# Patient Record
Sex: Female | Born: 1970 | Race: Black or African American | Hispanic: No | Marital: Married | Smoking: Former smoker
Health system: Southern US, Community
[De-identification: ages and names within clinical notes are randomized; demographics above are authoritative.]

## PROBLEM LIST (undated history)

## (undated) DIAGNOSIS — K529 Noninfective gastroenteritis and colitis, unspecified: Secondary | ICD-10-CM

## (undated) DIAGNOSIS — Z862 Personal history of diseases of the blood and blood-forming organs and certain disorders involving the immune mechanism: Secondary | ICD-10-CM

## (undated) DIAGNOSIS — M1990 Unspecified osteoarthritis, unspecified site: Secondary | ICD-10-CM

## (undated) DIAGNOSIS — R519 Headache, unspecified: Secondary | ICD-10-CM

## (undated) DIAGNOSIS — N390 Urinary tract infection, site not specified: Secondary | ICD-10-CM

## (undated) DIAGNOSIS — Z9289 Personal history of other medical treatment: Secondary | ICD-10-CM

## (undated) DIAGNOSIS — S0990XA Unspecified injury of head, initial encounter: Secondary | ICD-10-CM

## (undated) HISTORY — DX: Urinary tract infection, site not specified: N39.0

## (undated) HISTORY — DX: Headache, unspecified: R51.9

## (undated) HISTORY — PX: EYE SURGERY: SHX253

## (undated) HISTORY — DX: Unspecified osteoarthritis, unspecified site: M19.90

## (undated) HISTORY — DX: Unspecified injury of head, initial encounter: S09.90XA

## (undated) HISTORY — PX: GANGLION CYST EXCISION: SHX1691

---

## 2001-12-03 HISTORY — PX: ABDOMINAL HYSTERECTOMY: SHX81

## 2003-12-04 DIAGNOSIS — S0990XA Unspecified injury of head, initial encounter: Secondary | ICD-10-CM

## 2003-12-04 HISTORY — DX: Unspecified injury of head, initial encounter: S09.90XA

## 2004-02-17 ENCOUNTER — Inpatient Hospital Stay (HOSPITAL_COMMUNITY): Admission: RE | Admit: 2004-02-17 | Discharge: 2004-02-20 | Payer: Self-pay | Admitting: Obstetrics & Gynecology

## 2004-02-25 ENCOUNTER — Inpatient Hospital Stay (HOSPITAL_COMMUNITY): Admission: EM | Admit: 2004-02-25 | Discharge: 2004-02-28 | Payer: Self-pay | Admitting: Emergency Medicine

## 2006-05-25 ENCOUNTER — Inpatient Hospital Stay: Payer: Self-pay | Admitting: Internal Medicine

## 2006-06-10 ENCOUNTER — Ambulatory Visit: Payer: Self-pay | Admitting: Obstetrics and Gynecology

## 2007-10-01 ENCOUNTER — Emergency Department: Payer: Self-pay | Admitting: Internal Medicine

## 2007-12-21 ENCOUNTER — Emergency Department: Payer: Self-pay | Admitting: Emergency Medicine

## 2008-03-19 ENCOUNTER — Emergency Department: Payer: Self-pay | Admitting: Emergency Medicine

## 2009-05-31 ENCOUNTER — Emergency Department: Payer: Self-pay | Admitting: Emergency Medicine

## 2009-08-25 ENCOUNTER — Emergency Department: Payer: Self-pay | Admitting: Emergency Medicine

## 2010-04-20 ENCOUNTER — Emergency Department: Payer: Self-pay | Admitting: Emergency Medicine

## 2010-08-07 ENCOUNTER — Inpatient Hospital Stay: Payer: Self-pay | Admitting: Psychiatry

## 2010-08-17 ENCOUNTER — Emergency Department: Payer: Self-pay | Admitting: Emergency Medicine

## 2012-08-23 ENCOUNTER — Emergency Department: Payer: Self-pay | Admitting: Emergency Medicine

## 2012-08-23 LAB — COMPREHENSIVE METABOLIC PANEL
Anion Gap: 10 (ref 7–16)
Chloride: 109 mmol/L — ABNORMAL HIGH (ref 98–107)
Co2: 23 mmol/L (ref 21–32)
EGFR (African American): >60
EGFR (Non-African Amer.): >60
Glucose: 104 mg/dL — ABNORMAL HIGH (ref 65–99)
Potassium: 4 mmol/L (ref 3.5–5.1)
SGOT(AST): 23 U/L (ref 15–37)
SGPT (ALT): 23 U/L (ref 12–78)

## 2012-08-23 LAB — CBC
HCT: 39.5 % (ref 35.0–47.0)
HGB: 13.2 g/dL (ref 12.0–16.0)
MCV: 93 fL (ref 80–100)
Platelet: 148 10*3/uL — ABNORMAL LOW (ref 150–440)
RBC: 4.23 10*6/uL (ref 3.80–5.20)

## 2012-08-23 LAB — URINALYSIS, COMPLETE
Blood: NEGATIVE
Glucose,UR: NEGATIVE mg/dL (ref 0–75)
Leukocyte Esterase: NEGATIVE
Protein: NEGATIVE
RBC,UR: 1 /HPF (ref 0–5)
Specific Gravity: 1.014 (ref 1.003–1.030)
Squamous Epithelial: 4
WBC UR: 2 /HPF (ref 0–5)

## 2012-09-06 ENCOUNTER — Emergency Department: Payer: Self-pay | Admitting: Internal Medicine

## 2012-09-06 LAB — COMPREHENSIVE METABOLIC PANEL
Albumin: 3.7 g/dL (ref 3.4–5.0)
Alkaline Phosphatase: 46 U/L — ABNORMAL LOW (ref 50–136)
Bilirubin,Total: 0.4 mg/dL (ref 0.2–1.0)
Calcium, Total: 8.6 mg/dL (ref 8.5–10.1)
Creatinine: 0.8 mg/dL (ref 0.60–1.30)
Glucose: 134 mg/dL — ABNORMAL HIGH (ref 65–99)
SGPT (ALT): 41 U/L (ref 12–78)
Sodium: 142 mmol/L (ref 136–145)
Total Protein: 6.6 g/dL (ref 6.4–8.2)

## 2012-09-06 LAB — URINALYSIS, COMPLETE
Blood: NEGATIVE
Glucose,UR: NEGATIVE mg/dL (ref 0–75)
Nitrite: NEGATIVE
Ph: 6 (ref 4.5–8.0)
RBC,UR: 2 /HPF (ref 0–5)
Squamous Epithelial: 3

## 2012-09-06 LAB — CBC
MCH: 31.8 pg (ref 26.0–34.0)
MCHC: 34 g/dL (ref 32.0–36.0)
MCV: 94 fL (ref 80–100)
Platelet: 184 10*3/uL (ref 150–440)
RBC: 4.36 10*6/uL (ref 3.80–5.20)

## 2013-07-22 ENCOUNTER — Emergency Department: Payer: Self-pay | Admitting: Emergency Medicine

## 2013-07-23 ENCOUNTER — Ambulatory Visit: Payer: Self-pay | Admitting: Internal Medicine

## 2013-08-05 ENCOUNTER — Ambulatory Visit (INDEPENDENT_AMBULATORY_CARE_PROVIDER_SITE_OTHER): Payer: Managed Care, Other (non HMO) | Admitting: Internal Medicine

## 2013-08-05 ENCOUNTER — Ambulatory Visit: Payer: Self-pay | Admitting: Internal Medicine

## 2013-08-05 ENCOUNTER — Encounter: Payer: Self-pay | Admitting: Internal Medicine

## 2013-08-05 VITALS — BP 108/88 | HR 78 | Temp 98.3°F | Ht 61.0 in | Wt 151.0 lb

## 2013-08-05 DIAGNOSIS — R1032 Left lower quadrant pain: Secondary | ICD-10-CM | POA: Insufficient documentation

## 2013-08-05 DIAGNOSIS — R238 Other skin changes: Secondary | ICD-10-CM

## 2013-08-05 DIAGNOSIS — R2243 Localized swelling, mass and lump, lower limb, bilateral: Secondary | ICD-10-CM | POA: Insufficient documentation

## 2013-08-05 DIAGNOSIS — Z1239 Encounter for other screening for malignant neoplasm of breast: Secondary | ICD-10-CM

## 2013-08-05 DIAGNOSIS — R609 Edema, unspecified: Secondary | ICD-10-CM

## 2013-08-05 DIAGNOSIS — R600 Localized edema: Secondary | ICD-10-CM | POA: Insufficient documentation

## 2013-08-05 DIAGNOSIS — Z0000 Encounter for general adult medical examination without abnormal findings: Secondary | ICD-10-CM

## 2013-08-05 LAB — COMPREHENSIVE METABOLIC PANEL
Albumin: 3.8 g/dL (ref 3.5–5.2)
BUN: 7 mg/dL (ref 6–23)
CO2: 27 mEq/L (ref 19–32)
Calcium: 9.6 mg/dL (ref 8.4–10.5)
Chloride: 107 mEq/L (ref 96–112)
GFR: 120.64 mL/min (ref >60.00)
Glucose, Bld: 79 mg/dL (ref 70–99)
Potassium: 3.5 mEq/L (ref 3.5–5.1)
Sodium: 140 mEq/L (ref 135–145)
Total Protein: 6.2 g/dL (ref 6.0–8.3)

## 2013-08-05 LAB — POCT URINALYSIS DIPSTICK
Bilirubin, UA: NEGATIVE
Glucose, UA: NEGATIVE
Ketones, UA: NEGATIVE
Leukocytes, UA: NEGATIVE
Nitrite, UA: NEGATIVE

## 2013-08-05 LAB — LIPID PANEL
Cholesterol: 177 mg/dL (ref 0–200)
LDL Cholesterol: 97 mg/dL (ref 0–99)
Triglycerides: 161 mg/dL — ABNORMAL HIGH (ref 0.0–149.0)

## 2013-08-05 LAB — CBC WITH DIFFERENTIAL/PLATELET
Basophils Absolute: 0 10*3/uL (ref 0.0–0.1)
Basophils Relative: 0.2 % (ref 0.0–3.0)
Eosinophils Absolute: 0.1 10*3/uL (ref 0.0–0.7)
HCT: 41.4 % (ref 36.0–46.0)
Hemoglobin: 14.1 g/dL (ref 12.0–15.0)
Lymphocytes Relative: 22.4 % (ref 12.0–46.0)
Lymphs Abs: 2.4 10*3/uL (ref 0.7–4.0)
MCHC: 34 g/dL (ref 30.0–36.0)
Neutro Abs: 7.6 10*3/uL (ref 1.4–7.7)
RBC: 4.48 Mil/uL (ref 3.87–5.11)
RDW: 13.9 % (ref 11.5–14.6)

## 2013-08-05 NOTE — Assessment & Plan Note (Signed)
Nonpitting, bilateral lower extremity edema. Question hypothyroidism. Will check TSH with labs today. If labs are normal, would favor getting lower extremity venous Dopplers for further evaluation of chronic venous insufficiency.

## 2013-08-05 NOTE — Assessment & Plan Note (Signed)
Chronic left lower quadrant pain. Question if this may be related to scar tissue from endometriosis. Will get pelvic ultrasound for further evaluation. If ultrasound unrevealing, we discussed that we may need to proceed with CT of the abdomen.

## 2013-08-05 NOTE — Assessment & Plan Note (Signed)
Symptoms of easy bruising and menorrhagia are most consistent with von Willebrand's disease. Will send CBC and von Willebrand's panel with labs today.
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
## 2013-08-05 NOTE — Assessment & Plan Note (Signed)
Mammogram ordered today 

## 2013-08-06 ENCOUNTER — Telehealth: Payer: Self-pay | Admitting: Internal Medicine

## 2013-08-06 NOTE — Telephone Encounter (Addendum)
Pelvic US showed a small right ovarian cyst, however no other abnormalities to explain left lower quadrant pain. If pain is persistent we should get a CT of the abdomen and pelvis for further evaluation.

## 2013-08-07 LAB — VON WILLEBRAND PANEL: Ristocetin Co-factor, Plasma: 141 % (ref 42–200)

## 2013-08-07 NOTE — Telephone Encounter (Signed)
Tried to call patient but the number we have on file is not in service. Will mail letter to patient home address on file.

## 2013-08-10 ENCOUNTER — Encounter: Payer: Self-pay | Admitting: *Deleted

## 2013-08-11 ENCOUNTER — Encounter: Payer: Self-pay | Admitting: *Deleted

## 2013-08-11 NOTE — Telephone Encounter (Signed)
Letter mailed to patient home address on file  

## 2013-08-20 ENCOUNTER — Encounter: Payer: Self-pay | Admitting: Internal Medicine

## 2013-12-25 ENCOUNTER — Encounter: Payer: Self-pay | Admitting: Adult Health

## 2013-12-25 ENCOUNTER — Encounter (INDEPENDENT_AMBULATORY_CARE_PROVIDER_SITE_OTHER): Payer: Self-pay

## 2013-12-25 ENCOUNTER — Ambulatory Visit (INDEPENDENT_AMBULATORY_CARE_PROVIDER_SITE_OTHER): Payer: Managed Care, Other (non HMO) | Admitting: Adult Health

## 2013-12-25 VITALS — BP 110/72 | HR 83 | Temp 97.9°F | Resp 12 | Wt 150.5 lb

## 2013-12-25 DIAGNOSIS — L299 Pruritus, unspecified: Secondary | ICD-10-CM

## 2013-12-25 NOTE — Progress Notes (Signed)
Pre visit review using our clinic review tool, if applicable. No additional management support is needed unless otherwise documented below in the visit note. 

## 2013-12-25 NOTE — Assessment & Plan Note (Signed)
Benadryl 25 mg every 6 hours as needed. Avveno Body Wash - Oatmeal If no improvement within 1 week please call.

## 2013-12-25 NOTE — Progress Notes (Signed)
   Subjective:    Patient ID: Elisha PonderMichelle Karel, female    DOB: 12/20/1970, 43 y.o.   MRN: [id number]  HPI Patient is a pleasant 43 year old female who presents to clinic with complaints of "itching all over". Patient reports that she was exposed to poison ivy during the summer. She has been itching off and on ever since. She reports not using any new lotions, soaps, detergents, fabric softeners, etc. She does not take any medication. She has not tried any medication for the itchiness. She has irritation in between her fingers. No rash is or eruptions on her skin elsewhere.   Review of Systems Positive for skin itchiness everywhere except genitals Negative for rash, skin breakouts, using any new products (lotions, soaps, detergents, perfumes, fabric softeners, medications)    Objective:   Physical Exam  Constitutional: She is oriented to person, place, and time. She appears well-developed and well-nourished. No distress.  Cardiovascular: Normal rate and regular rhythm.   Pulmonary/Chest: Effort normal. No respiratory distress.  Neurological: She is alert and oriented to person, place, and time.  Skin: Skin is warm and dry. No rash noted. No erythema.  Psychiatric: She has a normal mood and affect. Her behavior is normal. Judgment and thought content normal.          Assessment & Plan:

## 2013-12-25 NOTE — Patient Instructions (Signed)
  Start benadryl 25 mg every 6 hours as needed for itching. Use this for approximately 1 week.  Use Aveno body wash - oatmeal.  If no improvement in 1 week please call the office.

## 2014-01-05 ENCOUNTER — Telehealth: Payer: Self-pay | Admitting: Internal Medicine

## 2014-01-05 NOTE — Telephone Encounter (Signed)
Relevant patient education mailed to patient.  

## 2014-01-21 ENCOUNTER — Ambulatory Visit: Payer: Managed Care, Other (non HMO) | Admitting: Internal Medicine

## 2014-01-22 ENCOUNTER — Ambulatory Visit: Payer: Managed Care, Other (non HMO) | Admitting: Internal Medicine

## 2014-01-27 ENCOUNTER — Emergency Department: Payer: Self-pay | Admitting: Emergency Medicine

## 2014-02-02 ENCOUNTER — Telehealth: Payer: Self-pay | Admitting: *Deleted

## 2014-02-02 NOTE — Telephone Encounter (Signed)
Can we request the notes from the ED visit so I can see what they wrote for?

## 2014-02-02 NOTE — Telephone Encounter (Signed)
Left message to call back  

## 2014-02-02 NOTE — Telephone Encounter (Signed)
Patient called stating she has a rash all over and it is getting worse. It is now in her eyes and her throat is burning. She did go to the ED and they told her it was scabies. She has an appointment with Dr. Dan HumphreysWalker on Thursday but state she can not wait that long to be seen, the itching is driving her crazy and it is very painful.

## 2014-02-03 NOTE — Telephone Encounter (Signed)
Left another message on patient voicemail requesting a return call. Also stated to continue the Benadryl per Dr. Tilman NeatWalker's instructions and any antibiotics that were given to her when she went to the ED.

## 2014-02-04 ENCOUNTER — Encounter (INDEPENDENT_AMBULATORY_CARE_PROVIDER_SITE_OTHER): Payer: Self-pay

## 2014-02-04 ENCOUNTER — Ambulatory Visit (INDEPENDENT_AMBULATORY_CARE_PROVIDER_SITE_OTHER): Payer: Managed Care, Other (non HMO) | Admitting: Internal Medicine

## 2014-02-04 ENCOUNTER — Encounter: Payer: Self-pay | Admitting: Internal Medicine

## 2014-02-04 VITALS — BP 102/82 | HR 92 | Temp 98.6°F | Wt 145.0 lb

## 2014-02-04 DIAGNOSIS — F411 Generalized anxiety disorder: Secondary | ICD-10-CM | POA: Insufficient documentation

## 2014-02-04 DIAGNOSIS — R51 Headache: Secondary | ICD-10-CM

## 2014-02-04 DIAGNOSIS — R519 Headache, unspecified: Secondary | ICD-10-CM | POA: Insufficient documentation

## 2014-02-04 DIAGNOSIS — B86 Scabies: Secondary | ICD-10-CM

## 2014-02-04 MED ORDER — IVERMECTIN 3 MG PO TABS: 200.0000 ug/kg | ORAL_TABLET | Freq: Once | ORAL | Status: DC

## 2014-02-04 MED ORDER — ALPRAZOLAM 0.25 MG PO TABS: 0.2500 mg | ORAL_TABLET | Freq: Two times a day (BID) | ORAL | Status: DC | PRN

## 2014-02-04 NOTE — Patient Instructions (Signed)
Scabies  Scabies are small bugs (mites) that burrow under the skin and cause red bumps and severe itching. These bugs can only be seen with a microscope. Scabies are highly contagious. They can spread easily from person to person by direct contact. They are also spread through sharing clothing or linens that have the scabies mites living in them. It is not unusual for an entire family to become infected through shared towels, clothing, or bedding.   HOME CARE INSTRUCTIONS   · Your caregiver may prescribe a cream or lotion to kill the mites. If cream is prescribed, massage the cream into the entire body from the neck to the bottom of both feet. Also massage the cream into the scalp and face if your child is less than 1 year old. Avoid the eyes and mouth. Do not wash your hands after application.  · Leave the cream on for 8 to 12 hours. Your child should bathe or shower after the 8 to 12 hour application period. Sometimes it is helpful to apply the cream to your child right before bedtime.  · One treatment is usually effective and will eliminate approximately 95% of infestations. For severe cases, your caregiver may decide to repeat the treatment in 1 week. Everyone in your household should be treated with one application of the cream.  · New rashes or burrows should not appear within 24 to 48 hours after successful treatment. However, the itching and rash may last for 2 to 4 weeks after successful treatment. Your caregiver may prescribe a medicine to help with the itching or to help the rash go away more quickly.  · Scabies can live on clothing or linens for up to 3 days. All of your child's recently used clothing, towels, stuffed toys, and bed linens should be washed in hot water and then dried in a dryer for at least 20 minutes on high heat. Items that cannot be washed should be enclosed in a plastic bag for at least 3 days.  · To help relieve itching, bathe your child in a cool bath or apply cool washcloths to the  affected areas.  · Your child may return to school after treatment with the prescribed cream.  SEEK MEDICAL CARE IF:   · The itching persists longer than 4 weeks after treatment.  · The rash spreads or becomes infected. Signs of infection include red blisters or yellow-tan crust.  Document Released: 11/19/2005 Document Revised: 02/11/2012 Document Reviewed: 03/30/2009  [employer]® Patient Information ©2014 [employer].

## 2014-02-04 NOTE — Progress Notes (Signed)
Pre visit review using our clinic review tool, if applicable. No additional management support is needed unless otherwise documented below in the visit note. 

## 2014-02-04 NOTE — Assessment & Plan Note (Signed)
Symptoms and exam consistent with scabies. Pt has not started treatment as prescribed in the ED. She requests oral treatment. Will start Ivermectin 2300mcg/kg x1 today and then repeated in 2 weeks. Recommended cleaning her home, including sheets, clothes, etc. Information given on Scabies today. Follow up if any questions or concerns.
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
## 2014-02-04 NOTE — Assessment & Plan Note (Signed)
Persistent headache. Likely exacerbated by tension and anxiety, however given h/o traumatic brain injury with ICH, will get MRI brain for further evaluation.

## 2014-02-04 NOTE — Assessment & Plan Note (Signed)
Recent increased anxiety related to situation with roommate and scabies infection. Will start prn alprazolam. Encouraged her to be cautious with this medication as it may cause drowsiness.

## 2014-02-05 ENCOUNTER — Telehealth: Payer: Self-pay | Admitting: Internal Medicine

## 2014-02-05 ENCOUNTER — Ambulatory Visit: Payer: Self-pay | Admitting: Internal Medicine

## 2014-02-05 NOTE — Telephone Encounter (Signed)
Relevant patient education mailed to patient.  

## 2014-02-05 NOTE — Telephone Encounter (Signed)
Pt seen in office yesterday.

## 2014-02-08 ENCOUNTER — Telehealth: Payer: Self-pay | Admitting: Internal Medicine

## 2014-02-08 NOTE — Telephone Encounter (Signed)
Patient informed and verbalized understanding

## 2014-02-08 NOTE — Telephone Encounter (Signed)
MRI brain showed chronic changes consistent with history of previous head injury, but no new findings.

## 2014-02-16 ENCOUNTER — Ambulatory Visit: Payer: Managed Care, Other (non HMO) | Admitting: Internal Medicine

## 2014-02-24 ENCOUNTER — Encounter: Payer: Self-pay | Admitting: Internal Medicine

## 2014-02-25 ENCOUNTER — Encounter: Payer: Self-pay | Admitting: Internal Medicine

## 2014-02-25 ENCOUNTER — Ambulatory Visit (INDEPENDENT_AMBULATORY_CARE_PROVIDER_SITE_OTHER): Payer: Managed Care, Other (non HMO) | Admitting: Internal Medicine

## 2014-02-25 ENCOUNTER — Encounter (INDEPENDENT_AMBULATORY_CARE_PROVIDER_SITE_OTHER): Payer: Self-pay

## 2014-02-25 VITALS — BP 120/88 | HR 87 | Temp 98.2°F | Wt 146.0 lb

## 2014-02-25 DIAGNOSIS — N952 Postmenopausal atrophic vaginitis: Secondary | ICD-10-CM

## 2014-02-25 DIAGNOSIS — Z1239 Encounter for other screening for malignant neoplasm of breast: Secondary | ICD-10-CM

## 2014-02-25 DIAGNOSIS — B86 Scabies: Secondary | ICD-10-CM

## 2014-02-25 MED ORDER — ESTROGENS, CONJUGATED 0.625 MG/GM VA CREA: 1.0000 | TOPICAL_CREAM | Freq: Every day | VAGINAL | Status: DC

## 2014-02-25 MED ORDER — PERMETHRIN 5 % EX CREA: 1.0000 "application " | TOPICAL_CREAM | Freq: Once | CUTANEOUS | Status: DC

## 2014-02-25 NOTE — Assessment & Plan Note (Signed)
Symptoms most consistent with atrophic vaginitis. Will try topical premarin. Discussed potential risks of this medication. Plan follow up in 6 months and prn.

## 2014-02-25 NOTE — Progress Notes (Addendum)
Subjective:    Patient ID: Tara Bray, female    DOB: 1971-07-22, 43 y.o.   MRN: [id number]  HPI 43YO female presents for follow up.  Scabies - she completed treatment with ivermectin and had some improvement and itching. However, her roommate did not seek treatment. She did not clean her home or car. She now reports to me current symptoms of itching and red bumps over her abdomen and hands. She is scheduled to take a second dose of ivermectin today.  She is also concerned about some recent vaginal dryness. This is been ongoing for several months. She denies vaginal discharge or bleeding. No vaginal pain.  Review of Systems  Constitutional: Negative for fever, chills, appetite change, fatigue and unexpected weight change.  HENT: Negative for congestion, ear pain, sinus pressure, sore throat, trouble swallowing and voice change.   Eyes: Negative for visual disturbance.  Respiratory: Negative for cough, shortness of breath, wheezing and stridor.   Cardiovascular: Negative for chest pain, palpitations and leg swelling.  Gastrointestinal: Negative for nausea, vomiting, abdominal pain, diarrhea, constipation, blood in stool, abdominal distention and anal bleeding.  Genitourinary: Positive for dyspareunia. Negative for dysuria, flank pain, vaginal bleeding, vaginal discharge and vaginal pain.  Musculoskeletal: Negative for arthralgias, gait problem, myalgias and neck pain.  Skin: Positive for rash. Negative for color change.  Neurological: Negative for dizziness and headaches.  Hematological: Negative for adenopathy. Does not bruise/bleed easily.  Psychiatric/Behavioral: Negative for suicidal ideas, sleep disturbance and dysphoric mood. The patient is not nervous/anxious.        Objective:    BP 120/88  Pulse 87  Temp(Src) 98.2 F (36.8 C) (Oral)  Wt 146 lb (66.225 kg)  SpO2 98% Physical Exam  Constitutional: She is oriented to person, place, and time. She appears  well-developed and well-nourished. No distress.  HENT:  Head: Normocephalic and atraumatic.  Right Ear: External ear normal.  Left Ear: External ear normal.  Nose: Nose normal.  Mouth/Throat: Oropharynx is clear and moist.  Eyes: Conjunctivae are normal. Pupils are equal, round, and reactive to light. Right eye exhibits no discharge. Left eye exhibits no discharge. No scleral icterus.  Neck: Normal range of motion. Neck supple. No tracheal deviation present. No thyromegaly present.  Pulmonary/Chest: Effort normal. No accessory muscle usage. Not tachypneic. She has no decreased breath sounds. She has no rhonchi.  Musculoskeletal: Normal range of motion. She exhibits no edema and no tenderness.  Lymphadenopathy:    She has no cervical adenopathy.  Neurological: She is alert and oriented to person, place, and time. No cranial nerve deficit. She exhibits normal muscle tone. Coordination normal.  Skin: Skin is warm and dry. Rash (erythematous papules hands) noted. She is not diaphoretic. No erythema. No pallor.  Psychiatric: She has a normal mood and affect. Her behavior is normal. Judgment and thought content normal.          Assessment & Plan:   Problem List Items Addressed This Visit   Atrophic vaginitis     Symptoms most consistent with atrophic vaginitis. Will try topical premarin. Discussed potential risks of this medication. Plan follow up in 6 months and prn.    Relevant Medications      PREMARIN 0.625 MG/GM VA CREA   Scabies - Primary     Week current scabies. She did not follow instructions about cleaning her house and car. She will take a second dose of ivermectin today. Reviewed how important it is for her to clean her car, clothes, and  furniture. Will add course of topical permethrin in 1 week after she completes cleaning of her home.    Relevant Medications      ACTICIN 5 % EX CREA   Screening for breast cancer   Relevant Orders      MM Digital Screening        Return in about 6 months (around 08/28/2014), or if symptoms worsen or fail to improve, for Physical.

## 2014-02-25 NOTE — Progress Notes (Signed)
Pre visit review using our clinic review tool, if applicable. No additional management support is needed unless otherwise documented below in the visit note. 

## 2014-02-25 NOTE — Patient Instructions (Signed)
Scabies  Scabies are small bugs (mites) that burrow under the skin and cause red bumps and severe itching. These bugs can only be seen with a microscope. Scabies are highly contagious. They can spread easily from person to person by direct contact. They are also spread through sharing clothing or linens that have the scabies mites living in them. It is not unusual for an entire family to become infected through shared towels, clothing, or bedding.   HOME CARE INSTRUCTIONS   · Your caregiver may prescribe a cream or lotion to kill the mites. If cream is prescribed, massage the cream into the entire body from the neck to the bottom of both feet. Also massage the cream into the scalp and face if your child is less than 1 year old. Avoid the eyes and mouth. Do not wash your hands after application.  · Leave the cream on for 8 to 12 hours. Your child should bathe or shower after the 8 to 12 hour application period. Sometimes it is helpful to apply the cream to your child right before bedtime.  · One treatment is usually effective and will eliminate approximately 95% of infestations. For severe cases, your caregiver may decide to repeat the treatment in 1 week. Everyone in your household should be treated with one application of the cream.  · New rashes or burrows should not appear within 24 to 48 hours after successful treatment. However, the itching and rash may last for 2 to 4 weeks after successful treatment. Your caregiver may prescribe a medicine to help with the itching or to help the rash go away more quickly.  · Scabies can live on clothing or linens for up to 3 days. All of your child's recently used clothing, towels, stuffed toys, and bed linens should be washed in hot water and then dried in a dryer for at least 20 minutes on high heat. Items that cannot be washed should be enclosed in a plastic bag for at least 3 days.  · To help relieve itching, bathe your child in a cool bath or apply cool washcloths to the  affected areas.  · Your child may return to school after treatment with the prescribed cream.  SEEK MEDICAL CARE IF:   · The itching persists longer than 4 weeks after treatment.  · The rash spreads or becomes infected. Signs of infection include red blisters or yellow-tan crust.  Document Released: 11/19/2005 Document Revised: 02/11/2012 Document Reviewed: 03/30/2009  [employer]® Patient Information ©2014 [employer].

## 2014-02-25 NOTE — Assessment & Plan Note (Signed)
Week current scabies. She did not follow instructions about cleaning her house and car. She will take a second dose of ivermectin today. Reviewed how important it is for her to clean her car, clothes, and furniture. Will add course of topical permethrin in 1 week after she completes cleaning of her home.

## 2014-02-26 ENCOUNTER — Encounter: Payer: Self-pay | Admitting: Emergency Medicine

## 2014-08-18 ENCOUNTER — Encounter: Payer: Self-pay | Admitting: Internal Medicine

## 2014-09-02 ENCOUNTER — Encounter: Payer: Managed Care, Other (non HMO) | Admitting: Internal Medicine

## 2014-09-20 ENCOUNTER — Telehealth: Payer: Self-pay | Admitting: Internal Medicine

## 2014-09-20 NOTE — Telephone Encounter (Signed)
Ms. Tara Bray stopped by to schedule her yearly CPE but was unable to do so because of her work schedule. She said she works from 7am-3:30pm. She said she can get here by 3:40pm if Dr. Dan HumphreysWalker will permit her to do so. I asked her if she wanted to discuss her schedule with her employer to see if they would work with her but she said, "No, there's no way I can leave early." Please call the patient.  Pt ph# [phone] Thank you.

## 2014-09-21 NOTE — Telephone Encounter (Signed)
We can use a 3:30pm on a Thursday or Friday

## 2014-09-21 NOTE — Telephone Encounter (Signed)
Left message for the patient to call back to schedule her CPE before the end of the month.

## 2014-09-21 NOTE — Telephone Encounter (Signed)
Please see dr walkers note about scheduling pt an appt

## 2014-10-01 ENCOUNTER — Ambulatory Visit (INDEPENDENT_AMBULATORY_CARE_PROVIDER_SITE_OTHER): Payer: Managed Care, Other (non HMO) | Admitting: Internal Medicine

## 2014-10-01 ENCOUNTER — Encounter: Payer: Self-pay | Admitting: Internal Medicine

## 2014-10-01 VITALS — BP 110/66 | HR 86 | Temp 98.6°F | Ht 61.5 in | Wt 151.0 lb

## 2014-10-01 DIAGNOSIS — Z0000 Encounter for general adult medical examination without abnormal findings: Secondary | ICD-10-CM

## 2014-10-01 DIAGNOSIS — F32A Depression, unspecified: Secondary | ICD-10-CM | POA: Insufficient documentation

## 2014-10-01 DIAGNOSIS — N952 Postmenopausal atrophic vaginitis: Secondary | ICD-10-CM

## 2014-10-01 DIAGNOSIS — F329 Major depressive disorder, single episode, unspecified: Secondary | ICD-10-CM

## 2014-10-01 LAB — CBC WITH DIFFERENTIAL/PLATELET
BASOS ABS: 0 10*3/uL (ref 0.0–0.1)
BASOS PCT: 0 % (ref 0–1)
EOS ABS: 0.1 10*3/uL (ref 0.0–0.7)
EOS PCT: 1 % (ref 0–5)
HEMATOCRIT: 41.6 % (ref 36.0–46.0)
Hemoglobin: 14.7 g/dL (ref 12.0–15.0)
Lymphocytes Relative: 27 % (ref 12–46)
Lymphs Abs: 2.8 10*3/uL (ref 0.7–4.0)
MCH: 31 pg (ref 26.0–34.0)
MCHC: 35.3 g/dL (ref 30.0–36.0)
MCV: 87.8 fL (ref 78.0–100.0)
MONO ABS: 0.6 10*3/uL (ref 0.1–1.0)
Monocytes Relative: 6 % (ref 3–12)
Neutro Abs: 6.9 10*3/uL (ref 1.7–7.7)
Neutrophils Relative %: 66 % (ref 43–77)
Platelets: 175 10*3/uL (ref 150–400)
RBC: 4.74 MIL/uL (ref 3.87–5.11)
RDW: 13.8 % (ref 11.5–15.5)
WBC: 10.5 10*3/uL (ref 4.0–10.5)

## 2014-10-01 LAB — COMPREHENSIVE METABOLIC PANEL
ALK PHOS: 52 U/L (ref 39–117)
ALT: 9 U/L (ref 0–35)
AST: 13 U/L (ref 0–37)
Albumin: 4 g/dL (ref 3.5–5.2)
BUN: 13 mg/dL (ref 6–23)
CO2: 26 mEq/L (ref 19–32)
CREATININE: 0.84 mg/dL (ref 0.50–1.10)
Calcium: 9.7 mg/dL (ref 8.4–10.5)
Chloride: 107 mEq/L (ref 96–112)
GLUCOSE: 83 mg/dL (ref 70–99)
Potassium: 3.7 mEq/L (ref 3.5–5.3)
Sodium: 144 mEq/L (ref 135–145)
Total Bilirubin: 0.3 mg/dL (ref 0.2–1.2)
Total Protein: 6.1 g/dL (ref 6.0–8.3)

## 2014-10-01 LAB — LIPID PANEL
CHOL/HDL RATIO: 4.1 ratio
Cholesterol: 196 mg/dL (ref 0–200)
HDL: 48 mg/dL (ref >39)
LDL CALC: 129 mg/dL — AB (ref 0–99)
TRIGLYCERIDES: 94 mg/dL (ref <150)
VLDL: 19 mg/dL (ref 0–40)

## 2014-10-01 LAB — TSH: TSH: 0.601 u[IU]/mL (ref 0.350–4.500)

## 2014-10-01 MED ORDER — FLUOXETINE HCL 20 MG PO TABS: 20.0000 mg | ORAL_TABLET | Freq: Every day | ORAL | Status: DC

## 2014-10-01 NOTE — Patient Instructions (Addendum)
Start Fluoxetine $RemoveBeforeDE'20mg'[id number]$  daily. We will set up an evaluation with a counselor at our [facility] office. Follow up here in 4 weeks.  Health Maintenance Adopting a healthy lifestyle and getting preventive care can go a long way to promote health and wellness. Talk with your health care provider about what schedule of regular examinations is right for you. This is a good chance for you to check in with your provider about disease prevention and staying healthy. In between checkups, there are plenty of things you can do on your own. Experts have done a lot of research about which lifestyle changes and preventive measures are most likely to keep you healthy. Ask your health care provider for more information. WEIGHT AND DIET  Eat a healthy diet  Be sure to include plenty of vegetables, fruits, low-fat dairy products, and lean protein.  Do not eat a lot of foods high in solid fats, added sugars, or salt.  Get regular exercise. This is one of the most important things you can do for your health.  Most adults should exercise for at least 150 minutes each week. The exercise should increase your heart rate and make you sweat (moderate-intensity exercise).  Most adults should also do strengthening exercises at least twice a week. This is in addition to the moderate-intensity exercise.  Maintain a healthy weight  Body mass index (BMI) is a measurement that can be used to identify possible weight problems. It estimates body fat based on height and weight. Your health care provider can help determine your BMI and help you achieve or maintain a healthy weight.  For females 16 years of age and older:   A BMI below 18.5 is considered underweight.  A BMI of 18.5 to 24.9 is normal.  A BMI of 25 to 29.9 is considered overweight.  A BMI of 30 and above is considered obese.  Watch levels of cholesterol and blood lipids  You should start having your blood tested for lipids and cholesterol at 44 years of  age, then have this test every 5 years.  You may need to have your cholesterol levels checked more often if:  Your lipid or cholesterol levels are high.  You are older than 44 years of age.  You are at high risk for heart disease.  CANCER SCREENING   Lung Cancer  Lung cancer screening is recommended for adults 5-56 years old who are at high risk for lung cancer because of a history of smoking.  A yearly low-dose CT scan of the lungs is recommended for people who:  Currently smoke.  Have quit within the past 15 years.  Have at least a 30-pack-year history of smoking. A pack year is smoking an average of one pack of cigarettes a day for 1 year.  Yearly screening should continue until it has been 15 years since you quit.  Yearly screening should stop if you develop a health problem that would prevent you from having lung cancer treatment.  Breast Cancer  Practice breast self-awareness. This means understanding how your breasts normally appear and feel.  It also means doing regular breast self-exams. Let your health care provider know about any changes, no matter how small.  If you are in your 20s or 30s, you should have a clinical breast exam (CBE) by a health care provider every 1-3 years as part of a regular health exam.  If you are 12 or older, have a CBE every year. Also consider having a breast X-ray (mammogram)  every year.  If you have a family history of breast cancer, talk to your health care provider about genetic screening.  If you are at high risk for breast cancer, talk to your health care provider about having an MRI and a mammogram every year.  Breast cancer gene (BRCA) assessment is recommended for women who have family members with BRCA-related cancers. BRCA-related cancers include:  Breast.  Ovarian.  Tubal.  Peritoneal cancers.  Results of the assessment will determine the need for genetic counseling and BRCA1 and BRCA2 testing. Cervical  Cancer Routine pelvic examinations to screen for cervical cancer are no longer recommended for nonpregnant women who are considered low risk for cancer of the pelvic organs (ovaries, uterus, and vagina) and who do not have symptoms. A pelvic examination may be necessary if you have symptoms including those associated with pelvic infections. Ask your health care provider if a screening pelvic exam is right for you.   The Pap test is the screening test for cervical cancer for women who are considered at risk.  If you had a hysterectomy for a problem that was not cancer or a condition that could lead to cancer, then you no longer need Pap tests.  If you are older than 65 years, and you have had normal Pap tests for the past 10 years, you no longer need to have Pap tests.  If you have had past treatment for cervical cancer or a condition that could lead to cancer, you need Pap tests and screening for cancer for at least 20 years after your treatment.  If you no longer get a Pap test, assess your risk factors if they change (such as having a new sexual partner). This can affect whether you should start being screened again.  Some women have medical problems that increase their chance of getting cervical cancer. If this is the case for you, your health care provider may recommend more frequent screening and Pap tests.  The human papillomavirus (HPV) test is another test that may be used for cervical cancer screening. The HPV test looks for the virus that can cause cell changes in the cervix. The cells collected during the Pap test can be tested for HPV.  The HPV test can be used to screen women 50 years of age and older. Getting tested for HPV can extend the interval between normal Pap tests from three to five years.  An HPV test also should be used to screen women of any age who have unclear Pap test results.  After 44 years of age, women should have HPV testing as often as Pap tests.  Colorectal  Cancer  This type of cancer can be detected and often prevented.  Routine colorectal cancer screening usually begins at 44 years of age and continues through 44 years of age.  Your health care provider may recommend screening at an earlier age if you have risk factors for colon cancer.  Your health care provider may also recommend using home test kits to check for hidden blood in the stool.  A small camera at the end of a tube can be used to examine your colon directly (sigmoidoscopy or colonoscopy). This is done to check for the earliest forms of colorectal cancer.  Routine screening usually begins at age 20.  Direct examination of the colon should be repeated every 5-10 years through 44 years of age. However, you may need to be screened more often if early forms of precancerous polyps or small growths are  found. Skin Cancer  Check your skin from head to toe regularly.  Tell your health care provider about any new moles or changes in moles, especially if there is a change in a mole's shape or color.  Also tell your health care provider if you have a mole that is larger than the size of a pencil eraser.  Always use sunscreen. Apply sunscreen liberally and repeatedly throughout the day.  Protect yourself by wearing long sleeves, pants, a wide-brimmed hat, and sunglasses whenever you are outside. HEART DISEASE, DIABETES, AND HIGH BLOOD PRESSURE   Have your blood pressure checked at least every 1-2 years. High blood pressure causes heart disease and increases the risk of stroke.  If you are between 8 years and 32 years old, ask your health care provider if you should take aspirin to prevent strokes.  Have regular diabetes screenings. This involves taking a blood sample to check your fasting blood sugar level.  If you are at a normal weight and have a low risk for diabetes, have this test once every three years after 44 years of age.  If you are overweight and have a high risk for  diabetes, consider being tested at a younger age or more often. PREVENTING INFECTION  Hepatitis B  If you have a higher risk for hepatitis B, you should be screened for this virus. You are considered at high risk for hepatitis B if:  You were born in a country where hepatitis B is common. Ask your health care provider which countries are considered high risk.  Your parents were born in a high-risk country, and you have not been immunized against hepatitis B (hepatitis B vaccine).  You have HIV or AIDS.  You use needles to inject street drugs.  You live with someone who has hepatitis B.  You have had sex with someone who has hepatitis B.  You get hemodialysis treatment.  You take certain medicines for conditions, including cancer, organ transplantation, and autoimmune conditions. Hepatitis C  Blood testing is recommended for:  Everyone born from [birth year] through 1966.  Anyone with known risk factors for hepatitis C. Sexually transmitted infections (STIs)  You should be screened for sexually transmitted infections (STIs) including gonorrhea and chlamydia if:  You are sexually active and are younger than 44 years of age.  You are older than 44 years of age and your health care provider tells you that you are at risk for this type of infection.  Your sexual activity has changed since you were last screened and you are at an increased risk for chlamydia or gonorrhea. Ask your health care provider if you are at risk.  If you do not have HIV, but are at risk, it may be recommended that you take a prescription medicine daily to prevent HIV infection. This is called pre-exposure prophylaxis (PrEP). You are considered at risk if:  You are sexually active and do not regularly use condoms or know the HIV status of your partner(s).  You take drugs by injection.  You are sexually active with a partner who has HIV. Talk with your health care provider about whether you are at high risk of  being infected with HIV. If you choose to begin PrEP, you should first be tested for HIV. You should then be tested every 3 months for as long as you are taking PrEP.  PREGNANCY   If you are premenopausal and you may become pregnant, ask your health care provider about preconception counseling.  If  you may become pregnant, take 400 to 800 micrograms (mcg) of folic acid every day.  If you want to prevent pregnancy, talk to your health care provider about birth control (contraception). OSTEOPOROSIS AND MENOPAUSE   Osteoporosis is a disease in which the bones lose minerals and strength with aging. This can result in serious bone fractures. Your risk for osteoporosis can be identified using a bone density scan.  If you are 100 years of age or older, or if you are at risk for osteoporosis and fractures, ask your health care provider if you should be screened.  Ask your health care provider whether you should take a calcium or vitamin D supplement to lower your risk for osteoporosis.  Menopause may have certain physical symptoms and risks.  Hormone replacement therapy may reduce some of these symptoms and risks. Talk to your health care provider about whether hormone replacement therapy is right for you.  HOME CARE INSTRUCTIONS   Schedule regular health, dental, and eye exams.  Stay current with your immunizations.   Do not use any tobacco products including cigarettes, chewing tobacco, or electronic cigarettes.  If you are pregnant, do not drink alcohol.  If you are breastfeeding, limit how much and how often you drink alcohol.  Limit alcohol intake to no more than 1 drink per day for nonpregnant women. One drink equals 12 ounces of beer, 5 ounces of wine, or 1 ounces of hard liquor.  Do not use street drugs.  Do not share needles.  Ask your health care provider for help if you need support or information about quitting drugs.  Tell your health care provider if you often feel  depressed.  Tell your health care provider if you have ever been abused or do not feel safe at home. Document Released: 06/04/2011 Document Revised: 04/05/2014 Document Reviewed: 10/21/2013 [facility] Patient Information 2015 [place], [place]. This information is not intended to replace advice given to you by your health care provider. Make sure you discuss any questions you have with your health care provider.
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
## 2014-10-01 NOTE — Assessment & Plan Note (Addendum)
General medical exam including breast exam normal. PAP and pelvic deferred given h/o hysterectomy. Mammogram ordered. Labs today including CBC, CMP, lipids. Flu vaccine declined.

## 2014-10-01 NOTE — Progress Notes (Signed)
Pre visit review using our clinic review tool, if applicable. No additional management support is needed unless otherwise documented below in the visit note. 

## 2014-10-01 NOTE — Assessment & Plan Note (Addendum)
Worsening symptoms of depression. PHQ9 - 16. Will set up referral for counseling. Start Fluoxetine 20mg  daily. Follow up in 4 weeks or sooner as needed. She will contract for safety.

## 2014-10-02 LAB — MICROALBUMIN / CREATININE URINE RATIO
Creatinine, Urine: 396.4 mg/dL
MICROALB UR: 1.6 mg/dL (ref <2.0)
Microalb Creat Ratio: 4 mg/g (ref 0.0–30.0)

## 2014-10-02 LAB — LUTEINIZING HORMONE: LH: 44.7 m[IU]/mL

## 2014-10-02 LAB — VITAMIN D 25 HYDROXY (VIT D DEFICIENCY, FRACTURES): Vit D, 25-Hydroxy: 11 ng/mL — ABNORMAL LOW (ref 30–89)

## 2014-10-02 LAB — FOLLICLE STIMULATING HORMONE: FSH: 105 m[IU]/mL

## 2014-10-06 ENCOUNTER — Other Ambulatory Visit: Payer: Self-pay | Admitting: *Deleted

## 2014-10-06 MED ORDER — ERGOCALCIFEROL 1.25 MG (50000 UT) PO CAPS: 50000.0000 [IU] | ORAL_CAPSULE | ORAL | Status: DC

## 2014-11-03 ENCOUNTER — Ambulatory Visit: Payer: Managed Care, Other (non HMO) | Admitting: Psychology

## 2014-11-23 ENCOUNTER — Ambulatory Visit: Payer: Managed Care, Other (non HMO) | Admitting: Internal Medicine

## 2014-12-28 ENCOUNTER — Emergency Department: Payer: Self-pay | Admitting: Internal Medicine

## 2015-03-06 ENCOUNTER — Emergency Department
Admit: 2015-03-06 | Disposition: A | Discharge status: Home / Self Care | Payer: Self-pay | Admitting: Physician Assistant

## 2015-03-06 LAB — URINALYSIS, COMPLETE
BILIRUBIN, UR: NEGATIVE
GLUCOSE, UR: NEGATIVE mg/dL (ref 0–75)
KETONE: NEGATIVE
Nitrite: NEGATIVE
PH: 5 (ref 4.5–8.0)
Protein: 30
RBC,UR: 23 /HPF (ref 0–5)
SPECIFIC GRAVITY: 1.02 (ref 1.003–1.030)
WBC UR: 169 /HPF (ref 0–5)

## 2015-03-08 LAB — URINE CULTURE

## 2015-08-23 ENCOUNTER — Emergency Department
Admission: EM | Admit: 2015-08-23 | Discharge: 2015-08-23 | Disposition: A | Discharge status: Home / Self Care | Payer: Managed Care, Other (non HMO) | Attending: Emergency Medicine | Admitting: Emergency Medicine

## 2015-08-23 ENCOUNTER — Encounter: Payer: Self-pay | Admitting: *Deleted

## 2015-08-23 DIAGNOSIS — N3090 Cystitis, unspecified without hematuria: Secondary | ICD-10-CM | POA: Insufficient documentation

## 2015-08-23 DIAGNOSIS — Z720 Tobacco use: Secondary | ICD-10-CM | POA: Insufficient documentation

## 2015-08-23 DIAGNOSIS — R300 Dysuria: Secondary | ICD-10-CM | POA: Diagnosis present

## 2015-08-23 DIAGNOSIS — Z79899 Other long term (current) drug therapy: Secondary | ICD-10-CM | POA: Insufficient documentation

## 2015-08-23 LAB — COMPREHENSIVE METABOLIC PANEL
ALT: 11 U/L — ABNORMAL LOW (ref 14–54)
ANION GAP: 7 (ref 5–15)
AST: 22 U/L (ref 15–41)
Albumin: 4.2 g/dL (ref 3.5–5.0)
Alkaline Phosphatase: 45 U/L (ref 38–126)
BUN: 8 mg/dL (ref 6–20)
CALCIUM: 10.1 mg/dL (ref 8.9–10.3)
CHLORIDE: 109 mmol/L (ref 101–111)
CO2: 29 mmol/L (ref 22–32)
Creatinine, Ser: 0.76 mg/dL (ref 0.44–1.00)
GFR calc non Af Amer: >60 mL/min (ref >60)
Glucose, Bld: 95 mg/dL (ref 65–99)
Potassium: 4.6 mmol/L (ref 3.5–5.1)
SODIUM: 145 mmol/L (ref 135–145)
Total Bilirubin: 0.5 mg/dL (ref 0.3–1.2)
Total Protein: 6.6 g/dL (ref 6.5–8.1)

## 2015-08-23 LAB — URINALYSIS COMPLETE WITH MICROSCOPIC (ARMC ONLY)
Bilirubin Urine: NEGATIVE
GLUCOSE, UA: NEGATIVE mg/dL
NITRITE: POSITIVE — AB
Protein, ur: NEGATIVE mg/dL
SPECIFIC GRAVITY, URINE: 1.012 (ref 1.005–1.030)
pH: 5 (ref 5.0–8.0)

## 2015-08-23 LAB — CBC WITH DIFFERENTIAL/PLATELET
Basophils Absolute: 0 10*3/uL (ref 0–0.1)
Basophils Relative: 0 %
EOS ABS: 0.1 10*3/uL (ref 0–0.7)
EOS PCT: 1 %
HCT: 42.1 % (ref 35.0–47.0)
Hemoglobin: 13.9 g/dL (ref 12.0–16.0)
LYMPHS ABS: 3.8 10*3/uL — AB (ref 1.0–3.6)
Lymphocytes Relative: 34 %
MCH: 30.2 pg (ref 26.0–34.0)
MCHC: 33 g/dL (ref 32.0–36.0)
MCV: 91.5 fL (ref 80.0–100.0)
MONO ABS: 0.7 10*3/uL (ref 0.2–0.9)
MONOS PCT: 6 %
Neutro Abs: 6.6 10*3/uL — ABNORMAL HIGH (ref 1.4–6.5)
Neutrophils Relative %: 59 %
PLATELETS: 149 10*3/uL — AB (ref 150–440)
RBC: 4.6 MIL/uL (ref 3.80–5.20)
RDW: 14.6 % — ABNORMAL HIGH (ref 11.5–14.5)
WBC: 11.3 10*3/uL — ABNORMAL HIGH (ref 3.6–11.0)

## 2015-08-23 MED ORDER — NITROFURANTOIN MACROCRYSTAL 100 MG PO CAPS: 100.0000 mg | ORAL_CAPSULE | Freq: Two times a day (BID) | ORAL | Status: DC

## 2015-08-23 NOTE — ED Notes (Signed)
Pt reports pain and burning upon urination for two days. Denies blood in urine. Denies nausea or vomiting. Pt reports left lower quadrant pain. Pt rates pain as 7/10.

## 2015-08-23 NOTE — Discharge Instructions (Signed)
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
## 2015-08-23 NOTE — ED Notes (Signed)
Pt has dysuria since yesterday.  Pt also reports left lower abd pain.  Pt has vaginal discharge.  No vag bleeding.

## 2015-08-24 MED ORDER — DICYCLOMINE HCL 10 MG PO CAPS
ORAL_CAPSULE | ORAL | Status: AC
  Filled 2015-08-24: qty 1

## 2015-11-09 ENCOUNTER — Encounter: Payer: Self-pay | Admitting: Internal Medicine

## 2015-11-09 ENCOUNTER — Ambulatory Visit (INDEPENDENT_AMBULATORY_CARE_PROVIDER_SITE_OTHER): Payer: Managed Care, Other (non HMO) | Admitting: Internal Medicine

## 2015-11-09 VITALS — BP 123/77 | HR 90 | Temp 98.4°F | Ht 60.5 in | Wt 146.2 lb

## 2015-11-09 DIAGNOSIS — Z0000 Encounter for general adult medical examination without abnormal findings: Secondary | ICD-10-CM | POA: Diagnosis not present

## 2015-11-09 LAB — LIPID PANEL
CHOLESTEROL: 171 mg/dL (ref 0–200)
HDL: 47.1 mg/dL (ref >39.00)
LDL CALC: 107 mg/dL — AB (ref 0–99)
NonHDL: 124.3
TRIGLYCERIDES: 88 mg/dL (ref 0.0–149.0)
Total CHOL/HDL Ratio: 4
VLDL: 17.6 mg/dL (ref 0.0–40.0)

## 2015-11-09 LAB — CBC WITH DIFFERENTIAL/PLATELET
BASOS PCT: 0.4 % (ref 0.0–3.0)
Basophils Absolute: 0 10*3/uL (ref 0.0–0.1)
EOS PCT: 1.1 % (ref 0.0–5.0)
Eosinophils Absolute: 0.1 10*3/uL (ref 0.0–0.7)
HEMATOCRIT: 37.8 % (ref 36.0–46.0)
HEMOGLOBIN: 12.1 g/dL (ref 12.0–15.0)
LYMPHS PCT: 31.6 % (ref 12.0–46.0)
Lymphs Abs: 3.2 10*3/uL (ref 0.7–4.0)
MCHC: 32 g/dL (ref 30.0–36.0)
MCV: 91.9 fl (ref 78.0–100.0)
Monocytes Absolute: 0.7 10*3/uL (ref 0.1–1.0)
Monocytes Relative: 7.1 % (ref 3.0–12.0)
NEUTROS ABS: 6.1 10*3/uL (ref 1.4–7.7)
Neutrophils Relative %: 59.8 % (ref 43.0–77.0)
PLATELETS: 150 10*3/uL (ref 150.0–400.0)
RBC: 4.12 Mil/uL (ref 3.87–5.11)
RDW: 14.1 % (ref 11.5–15.5)
WBC: 10.1 10*3/uL (ref 4.0–10.5)

## 2015-11-09 LAB — COMPREHENSIVE METABOLIC PANEL
ALBUMIN: 3.9 g/dL (ref 3.5–5.2)
ALT: 9 U/L (ref 0–35)
AST: 15 U/L (ref 0–37)
Alkaline Phosphatase: 44 U/L (ref 39–117)
BUN: 10 mg/dL (ref 6–23)
CALCIUM: 9.4 mg/dL (ref 8.4–10.5)
CHLORIDE: 110 meq/L (ref 96–112)
CO2: 28 meq/L (ref 19–32)
CREATININE: 0.62 mg/dL (ref 0.40–1.20)
GFR: 133.76 mL/min (ref >60.00)
Glucose, Bld: 82 mg/dL (ref 70–99)
POTASSIUM: 3.8 meq/L (ref 3.5–5.1)
Sodium: 143 mEq/L (ref 135–145)
Total Bilirubin: 0.3 mg/dL (ref 0.2–1.2)
Total Protein: 6 g/dL (ref 6.0–8.3)

## 2015-11-09 LAB — MICROALBUMIN / CREATININE URINE RATIO
CREATININE, U: 143.3 mg/dL
Microalb Creat Ratio: 1 mg/g (ref 0.0–30.0)
Microalb, Ur: 1.5 mg/dL (ref 0.0–1.9)

## 2015-11-09 LAB — TSH: TSH: 0.53 u[IU]/mL (ref 0.35–4.50)

## 2015-11-09 LAB — VITAMIN D 25 HYDROXY (VIT D DEFICIENCY, FRACTURES): VITD: 18.46 ng/mL — ABNORMAL LOW (ref 30.00–100.00)

## 2015-11-09 MED ORDER — FLUOXETINE HCL 20 MG PO TABS: 20.0000 mg | ORAL_TABLET | Freq: Every day | ORAL | Status: DC

## 2015-11-09 MED ORDER — ESTROGENS, CONJUGATED 0.625 MG/GM VA CREA: 1.0000 | TOPICAL_CREAM | Freq: Every day | VAGINAL | Status: DC

## 2015-11-09 MED ORDER — ALPRAZOLAM 0.25 MG PO TABS: 0.2500 mg | ORAL_TABLET | Freq: Two times a day (BID) | ORAL | Status: DC | PRN

## 2015-11-09 NOTE — Assessment & Plan Note (Signed)
General medical exam normal today including breast and pelvic exam. PAP pending. Mammogram scheduled. Flu vaccine declined. Labs as ordered. Encouraged healthy diet and exercise. Follow up 1 year and prn.

## 2015-11-09 NOTE — Progress Notes (Signed)
Pre visit review using our clinic review tool, if applicable. No additional management support is needed unless otherwise documented below in the visit note. 

## 2015-11-09 NOTE — Patient Instructions (Signed)
Health Maintenance, Female Adopting a healthy lifestyle and getting preventive care can go a long way to promote health and wellness. Talk with your health care provider about what schedule of regular examinations is right for you. This is a good chance for you to check in with your provider about disease prevention and staying healthy. In between checkups, there are plenty of things you can do on your own. Experts have done a lot of research about which lifestyle changes and preventive measures are most likely to keep you healthy. Ask your health care provider for more information. WEIGHT AND DIET  Eat a healthy diet  Be sure to include plenty of vegetables, fruits, low-fat dairy products, and lean protein.  Do not eat a lot of foods high in solid fats, added sugars, or salt.  Get regular exercise. This is one of the most important things you can do for your health.  Most adults should exercise for at least 150 minutes each week. The exercise should increase your heart rate and make you sweat (moderate-intensity exercise).  Most adults should also do strengthening exercises at least twice a week. This is in addition to the moderate-intensity exercise.  Maintain a healthy weight  Body mass index (BMI) is a measurement that can be used to identify possible weight problems. It estimates body fat based on height and weight. Your health care provider can help determine your BMI and help you achieve or maintain a healthy weight.  For females 20 years of age and older:   A BMI below 18.5 is considered underweight.  A BMI of 18.5 to 24.9 is normal.  A BMI of 25 to 29.9 is considered overweight.  A BMI of 30 and above is considered obese.  Watch levels of cholesterol and blood lipids  You should start having your blood tested for lipids and cholesterol at 45 years of age, then have this test every 5 years.  You may need to have your cholesterol levels checked more often if:  Your lipid  or cholesterol levels are high.  You are older than 45 years of age.  You are at high risk for heart disease.  CANCER SCREENING   Lung Cancer  Lung cancer screening is recommended for adults 55-80 years old who are at high risk for lung cancer because of a history of smoking.  A yearly low-dose CT scan of the lungs is recommended for people who:  Currently smoke.  Have quit within the past 15 years.  Have at least a 30-pack-year history of smoking. A pack year is smoking an average of one pack of cigarettes a day for 1 year.  Yearly screening should continue until it has been 15 years since you quit.  Yearly screening should stop if you develop a health problem that would prevent you from having lung cancer treatment.  Breast Cancer  Practice breast self-awareness. This means understanding how your breasts normally appear and feel.  It also means doing regular breast self-exams. Let your health care provider know about any changes, no matter how small.  If you are in your 20s or 30s, you should have a clinical breast exam (CBE) by a health care provider every 1-3 years as part of a regular health exam.  If you are 40 or older, have a CBE every year. Also consider having a breast X-ray (mammogram) every year.  If you have a family history of breast cancer, talk to your health care provider about genetic screening.  If you   are at high risk for breast cancer, talk to your health care provider about having an MRI and a mammogram every year.  Breast cancer gene (BRCA) assessment is recommended for women who have family members with BRCA-related cancers. BRCA-related cancers include:  Breast.  Ovarian.  Tubal.  Peritoneal cancers.  Results of the assessment will determine the need for genetic counseling and BRCA1 and BRCA2 testing. Cervical Cancer Your health care provider may recommend that you be screened regularly for cancer of the pelvic organs (ovaries, uterus, and  vagina). This screening involves a pelvic examination, including checking for microscopic changes to the surface of your cervix (Pap test). You may be encouraged to have this screening done every 3 years, beginning at age 21.  For women ages 30-65, health care providers may recommend pelvic exams and Pap testing every 3 years, or they may recommend the Pap and pelvic exam, combined with testing for human papilloma virus (HPV), every 5 years. Some types of HPV increase your risk of cervical cancer. Testing for HPV may also be done on women of any age with unclear Pap test results.  Other health care providers may not recommend any screening for nonpregnant women who are considered low risk for pelvic cancer and who do not have symptoms. Ask your health care provider if a screening pelvic exam is right for you.  If you have had past treatment for cervical cancer or a condition that could lead to cancer, you need Pap tests and screening for cancer for at least 20 years after your treatment. If Pap tests have been discontinued, your risk factors (such as having a new sexual partner) need to be reassessed to determine if screening should resume. Some women have medical problems that increase the chance of getting cervical cancer. In these cases, your health care provider may recommend more frequent screening and Pap tests. Colorectal Cancer  This type of cancer can be detected and often prevented.  Routine colorectal cancer screening usually begins at 45 years of age and continues through 45 years of age.  Your health care provider may recommend screening at an earlier age if you have risk factors for colon cancer.  Your health care provider may also recommend using home test kits to check for hidden blood in the stool.  A small camera at the end of a tube can be used to examine your colon directly (sigmoidoscopy or colonoscopy). This is done to check for the earliest forms of colorectal  cancer.  Routine screening usually begins at age 50.  Direct examination of the colon should be repeated every 5-10 years through 45 years of age. However, you may need to be screened more often if early forms of precancerous polyps or small growths are found. Skin Cancer  Check your skin from head to toe regularly.  Tell your health care provider about any new moles or changes in moles, especially if there is a change in a mole's shape or color.  Also tell your health care provider if you have a mole that is larger than the size of a pencil eraser.  Always use sunscreen. Apply sunscreen liberally and repeatedly throughout the day.  Protect yourself by wearing long sleeves, pants, a wide-brimmed hat, and sunglasses whenever you are outside. HEART DISEASE, DIABETES, AND HIGH BLOOD PRESSURE   High blood pressure causes heart disease and increases the risk of stroke. High blood pressure is more likely to develop in:  People who have blood pressure in the high end   of the normal range (130-139/85-89 mm Hg).  People who are overweight or obese.  People who are African American.  If you are 38-23 years of age, have your blood pressure checked every 3-5 years. If you are 61 years of age or older, have your blood pressure checked every year. You should have your blood pressure measured twice--once when you are at a hospital or clinic, and once when you are not at a hospital or clinic. Record the average of the two measurements. To check your blood pressure when you are not at a hospital or clinic, you can use:  An automated blood pressure machine at a pharmacy.  A home blood pressure monitor.  If you are between 45 years and 39 years old, ask your health care provider if you should take aspirin to prevent strokes.  Have regular diabetes screenings. This involves taking a blood sample to check your fasting blood sugar level.  If you are at a normal weight and have a low risk for diabetes,  have this test once every three years after 45 years of age.  If you are overweight and have a high risk for diabetes, consider being tested at a younger age or more often. PREVENTING INFECTION  Hepatitis B  If you have a higher risk for hepatitis B, you should be screened for this virus. You are considered at high risk for hepatitis B if:  You were born in a country where hepatitis B is common. Ask your health care provider which countries are considered high risk.  Your parents were born in a high-risk country, and you have not been immunized against hepatitis B (hepatitis B vaccine).  You have HIV or AIDS.  You use needles to inject street drugs.  You live with someone who has hepatitis B.  You have had sex with someone who has hepatitis B.  You get hemodialysis treatment.  You take certain medicines for conditions, including cancer, organ transplantation, and autoimmune conditions. Hepatitis C  Blood testing is recommended for:  Everyone born from [birth year] through 1966.  Anyone with known risk factors for hepatitis C. Sexually transmitted infections (STIs)  You should be screened for sexually transmitted infections (STIs) including gonorrhea and chlamydia if:  You are sexually active and are younger than 45 years of age.  You are older than 45 years of age and your health care provider tells you that you are at risk for this type of infection.  Your sexual activity has changed since you were last screened and you are at an increased risk for chlamydia or gonorrhea. Ask your health care provider if you are at risk.  If you do not have HIV, but are at risk, it may be recommended that you take a prescription medicine daily to prevent HIV infection. This is called pre-exposure prophylaxis (PrEP). You are considered at risk if:  You are sexually active and do not regularly use condoms or know the HIV status of your partner(s).  You take drugs by injection.  You are sexually  active with a partner who has HIV. Talk with your health care provider about whether you are at high risk of being infected with HIV. If you choose to begin PrEP, you should first be tested for HIV. You should then be tested every 3 months for as long as you are taking PrEP.  PREGNANCY   If you are premenopausal and you may become pregnant, ask your health care provider about preconception counseling.  If you may  become pregnant, take 400 to 800 micrograms (mcg) of folic acid every day.  If you want to prevent pregnancy, talk to your health care provider about birth control (contraception). OSTEOPOROSIS AND MENOPAUSE   Osteoporosis is a disease in which the bones lose minerals and strength with aging. This can result in serious bone fractures. Your risk for osteoporosis can be identified using a bone density scan.  If you are 61 years of age or older, or if you are at risk for osteoporosis and fractures, ask your health care provider if you should be screened.  Ask your health care provider whether you should take a calcium or vitamin D supplement to lower your risk for osteoporosis.  Menopause may have certain physical symptoms and risks.  Hormone replacement therapy may reduce some of these symptoms and risks. Talk to your health care provider about whether hormone replacement therapy is right for you.  HOME CARE INSTRUCTIONS   Schedule regular health, dental, and eye exams.  Stay current with your immunizations.   Do not use any tobacco products including cigarettes, chewing tobacco, or electronic cigarettes.  If you are pregnant, do not drink alcohol.  If you are breastfeeding, limit how much and how often you drink alcohol.  Limit alcohol intake to no more than 1 drink per day for nonpregnant women. One drink equals 12 ounces of beer, 5 ounces of wine, or 1 ounces of hard liquor.  Do not use street drugs.  Do not share needles.  Ask your health care provider for help if  you need support or information about quitting drugs.  Tell your health care provider if you often feel depressed.  Tell your health care provider if you have ever been abused or do not feel safe at home.   This information is not intended to replace advice given to you by your health care provider. Make sure you discuss any questions you have with your health care provider.   Document Released: 06/04/2011 Document Revised: 12/10/2014 Document Reviewed: 10/21/2013 Elsevier Interactive Patient Education [employer].
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
## 2015-11-10 ENCOUNTER — Other Ambulatory Visit (HOSPITAL_COMMUNITY)
Admission: RE | Admit: 2015-11-10 | Discharge: 2015-11-10 | Disposition: A | Discharge status: Home / Self Care | Payer: Managed Care, Other (non HMO) | Source: Ambulatory Visit | Attending: Internal Medicine | Admitting: Internal Medicine

## 2015-11-10 DIAGNOSIS — Z113 Encounter for screening for infections with a predominantly sexual mode of transmission: Secondary | ICD-10-CM | POA: Diagnosis present

## 2015-11-10 DIAGNOSIS — N760 Acute vaginitis: Secondary | ICD-10-CM | POA: Diagnosis present

## 2015-11-10 DIAGNOSIS — Z01419 Encounter for gynecological examination (general) (routine) without abnormal findings: Secondary | ICD-10-CM | POA: Insufficient documentation

## 2015-11-10 DIAGNOSIS — Z1151 Encounter for screening for human papillomavirus (HPV): Secondary | ICD-10-CM | POA: Diagnosis present

## 2015-11-10 NOTE — Addendum Note (Signed)
Addended by: Montine CircleMALDONADO, Alesha Jaffee D on: 11/10/2015 08:12 AM   Modules accepted: Orders

## 2015-11-14 LAB — CYTOLOGY - PAP

## 2015-11-15 ENCOUNTER — Other Ambulatory Visit: Payer: Self-pay | Admitting: *Deleted

## 2015-11-15 MED ORDER — METRONIDAZOLE 500 MG PO TABS: 500.0000 mg | ORAL_TABLET | Freq: Two times a day (BID) | ORAL | Status: DC

## 2015-11-16 LAB — CERVICOVAGINAL ANCILLARY ONLY
BACTERIAL VAGINITIS: POSITIVE — AB
Candida vaginitis: NEGATIVE
Herpes: NEGATIVE

## 2015-11-22 ENCOUNTER — Emergency Department
Admission: EM | Admit: 2015-11-22 | Discharge: 2015-11-22 | Disposition: A | Discharge status: Home / Self Care | Payer: Managed Care, Other (non HMO) | Attending: Emergency Medicine | Admitting: Emergency Medicine

## 2015-11-22 ENCOUNTER — Encounter: Payer: Self-pay | Admitting: Emergency Medicine

## 2015-11-22 ENCOUNTER — Emergency Department: Payer: Managed Care, Other (non HMO)

## 2015-11-22 DIAGNOSIS — N390 Urinary tract infection, site not specified: Secondary | ICD-10-CM | POA: Insufficient documentation

## 2015-11-22 DIAGNOSIS — F17200 Nicotine dependence, unspecified, uncomplicated: Secondary | ICD-10-CM | POA: Diagnosis not present

## 2015-11-22 DIAGNOSIS — Y998 Other external cause status: Secondary | ICD-10-CM | POA: Insufficient documentation

## 2015-11-22 DIAGNOSIS — Z79899 Other long term (current) drug therapy: Secondary | ICD-10-CM | POA: Diagnosis not present

## 2015-11-22 DIAGNOSIS — Y92410 Unspecified street and highway as the place of occurrence of the external cause: Secondary | ICD-10-CM | POA: Insufficient documentation

## 2015-11-22 DIAGNOSIS — S3992XA Unspecified injury of lower back, initial encounter: Secondary | ICD-10-CM | POA: Diagnosis present

## 2015-11-22 DIAGNOSIS — M545 Low back pain, unspecified: Secondary | ICD-10-CM

## 2015-11-22 DIAGNOSIS — Y9389 Activity, other specified: Secondary | ICD-10-CM | POA: Insufficient documentation

## 2015-11-22 LAB — URINALYSIS COMPLETE WITH MICROSCOPIC (ARMC ONLY)
BACTERIA UA: NONE SEEN
Bilirubin Urine: NEGATIVE
Glucose, UA: NEGATIVE mg/dL
HGB URINE DIPSTICK: NEGATIVE
KETONES UR: NEGATIVE mg/dL
NITRITE: NEGATIVE
PROTEIN: NEGATIVE mg/dL
SPECIFIC GRAVITY, URINE: 1.023 (ref 1.005–1.030)
pH: 6 (ref 5.0–8.0)

## 2015-11-22 MED ORDER — KETOROLAC TROMETHAMINE 30 MG/ML IJ SOLN
INTRAMUSCULAR | Status: AC
  Filled 2015-11-22: qty 2

## 2015-11-22 MED ORDER — IBUPROFEN 800 MG PO TABS: 800.0000 mg | ORAL_TABLET | Freq: Three times a day (TID) | ORAL | Status: DC | PRN

## 2015-11-22 MED ORDER — SULFAMETHOXAZOLE-TRIMETHOPRIM 800-160 MG PO TABS: 1.0000 | ORAL_TABLET | Freq: Two times a day (BID) | ORAL | Status: DC

## 2015-11-22 MED ORDER — CYCLOBENZAPRINE HCL 10 MG PO TABS: 10.0000 mg | ORAL_TABLET | Freq: Three times a day (TID) | ORAL | Status: DC | PRN

## 2015-11-22 MED ORDER — KETOROLAC TROMETHAMINE 60 MG/2ML IM SOLN
60.0000 mg | Freq: Once | INTRAMUSCULAR | Status: AC
  Administered 2015-11-22: 60 mg via INTRAMUSCULAR
  Filled 2015-11-22: qty 2
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
## 2015-11-22 NOTE — ED Notes (Signed)
Pt in restroom collecting urine sample.  

## 2015-11-22 NOTE — Discharge Instructions (Signed)

## 2016-09-15 ENCOUNTER — Emergency Department
Admission: EM | Admit: 2016-09-15 | Discharge: 2016-09-15 | Disposition: A | Discharge status: Home / Self Care | Payer: Managed Care, Other (non HMO) | Attending: Emergency Medicine | Admitting: Emergency Medicine

## 2016-09-15 ENCOUNTER — Encounter: Payer: Self-pay | Admitting: Emergency Medicine

## 2016-09-15 DIAGNOSIS — Z79899 Other long term (current) drug therapy: Secondary | ICD-10-CM | POA: Insufficient documentation

## 2016-09-15 DIAGNOSIS — Y929 Unspecified place or not applicable: Secondary | ICD-10-CM | POA: Insufficient documentation

## 2016-09-15 DIAGNOSIS — F17200 Nicotine dependence, unspecified, uncomplicated: Secondary | ICD-10-CM | POA: Insufficient documentation

## 2016-09-15 DIAGNOSIS — T148XXA Other injury of unspecified body region, initial encounter: Secondary | ICD-10-CM

## 2016-09-15 DIAGNOSIS — Z791 Long term (current) use of non-steroidal anti-inflammatories (NSAID): Secondary | ICD-10-CM | POA: Insufficient documentation

## 2016-09-15 DIAGNOSIS — Y9389 Activity, other specified: Secondary | ICD-10-CM | POA: Insufficient documentation

## 2016-09-15 DIAGNOSIS — Y999 Unspecified external cause status: Secondary | ICD-10-CM | POA: Insufficient documentation

## 2016-09-15 DIAGNOSIS — S39012A Strain of muscle, fascia and tendon of lower back, initial encounter: Secondary | ICD-10-CM | POA: Insufficient documentation

## 2016-09-15 DIAGNOSIS — M25512 Pain in left shoulder: Secondary | ICD-10-CM | POA: Insufficient documentation

## 2016-09-15 DIAGNOSIS — W1839XA Other fall on same level, initial encounter: Secondary | ICD-10-CM | POA: Insufficient documentation

## 2016-09-15 DIAGNOSIS — Z792 Long term (current) use of antibiotics: Secondary | ICD-10-CM | POA: Insufficient documentation

## 2016-09-15 HISTORY — DX: Noninfective gastroenteritis and colitis, unspecified: K52.9

## 2016-09-15 MED ORDER — NAPROXEN 500 MG PO TABS
500.0000 mg | ORAL_TABLET | Freq: Once | ORAL | Status: AC
  Administered 2016-09-15: 500 mg via ORAL
  Filled 2016-09-15: qty 1

## 2016-09-15 MED ORDER — NAPROXEN 500 MG PO TABS: 500.0000 mg | ORAL_TABLET | Freq: Two times a day (BID) | ORAL | 2 refills | Status: DC
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
## 2016-09-15 NOTE — ED Triage Notes (Signed)
Patient to ER for c/o low left sided back pain after fall yesterday while getting out of bath tub. Patient reports pain shoots down to right hip and right leg.

## 2016-12-10 ENCOUNTER — Emergency Department
Admission: EM | Admit: 2016-12-10 | Discharge: 2016-12-10 | Disposition: A | Discharge status: Home / Self Care | Payer: Managed Care, Other (non HMO) | Attending: Emergency Medicine | Admitting: Emergency Medicine

## 2016-12-10 ENCOUNTER — Encounter: Payer: Self-pay | Admitting: Physician Assistant

## 2016-12-10 DIAGNOSIS — F17200 Nicotine dependence, unspecified, uncomplicated: Secondary | ICD-10-CM | POA: Insufficient documentation

## 2016-12-10 DIAGNOSIS — H6502 Acute serous otitis media, left ear: Secondary | ICD-10-CM | POA: Insufficient documentation

## 2016-12-10 DIAGNOSIS — H9202 Otalgia, left ear: Secondary | ICD-10-CM

## 2016-12-10 MED ORDER — CETIRIZINE-PSEUDOEPHEDRINE ER 5-120 MG PO TB12: 1.0000 | ORAL_TABLET | Freq: Two times a day (BID) | ORAL | 0 refills | Status: DC

## 2016-12-10 MED ORDER — FLUTICASONE PROPIONATE 50 MCG/ACT NA SUSP: 2.0000 | Freq: Every day | NASAL | 0 refills | Status: DC
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
## 2016-12-10 NOTE — ED Triage Notes (Signed)
Pt to ED with sore throat and left ear pain x2 days.  Denies fevers at home but having chills.

## 2016-12-10 NOTE — Discharge Instructions (Signed)
Your exam did not show an ear infection. You have fluid collection behind the ear drum. Take the prescription meds as directed.

## 2017-02-23 ENCOUNTER — Emergency Department
Admission: EM | Admit: 2017-02-23 | Discharge: 2017-02-23 | Disposition: A | Discharge status: Home / Self Care | Payer: Managed Care, Other (non HMO) | Attending: Emergency Medicine | Admitting: Emergency Medicine

## 2017-02-23 DIAGNOSIS — N3001 Acute cystitis with hematuria: Secondary | ICD-10-CM

## 2017-02-23 DIAGNOSIS — Z79899 Other long term (current) drug therapy: Secondary | ICD-10-CM | POA: Insufficient documentation

## 2017-02-23 DIAGNOSIS — F17200 Nicotine dependence, unspecified, uncomplicated: Secondary | ICD-10-CM | POA: Insufficient documentation

## 2017-02-23 LAB — URINALYSIS, COMPLETE (UACMP) WITH MICROSCOPIC
Bilirubin Urine: NEGATIVE
GLUCOSE, UA: NEGATIVE mg/dL
Ketones, ur: NEGATIVE mg/dL
NITRITE: POSITIVE — AB
PROTEIN: 100 mg/dL — AB
SPECIFIC GRAVITY, URINE: 1.026 (ref 1.005–1.030)
pH: 6 (ref 5.0–8.0)

## 2017-02-23 LAB — POCT PREGNANCY, URINE: Preg Test, Ur: NEGATIVE

## 2017-02-23 MED ORDER — PHENAZOPYRIDINE HCL 100 MG PO TABS: 100.0000 mg | ORAL_TABLET | Freq: Three times a day (TID) | ORAL | 0 refills | Status: DC | PRN

## 2017-02-23 MED ORDER — CIPROFLOXACIN HCL 500 MG PO TABS
500.0000 mg | ORAL_TABLET | Freq: Once | ORAL | Status: AC
Start: 2017-02-23 — End: 2017-02-23
  Administered 2017-02-23: 500 mg via ORAL

## 2017-02-23 MED ORDER — PHENAZOPYRIDINE HCL 200 MG PO TABS
ORAL_TABLET | ORAL | Status: AC
  Filled 2017-02-23: qty 1

## 2017-02-23 MED ORDER — CIPROFLOXACIN HCL 500 MG PO TABS
ORAL_TABLET | ORAL | Status: AC
  Filled 2017-02-23: qty 1

## 2017-02-23 MED ORDER — PHENAZOPYRIDINE HCL 200 MG PO TABS
200.0000 mg | ORAL_TABLET | Freq: Once | ORAL | Status: AC
  Administered 2017-02-23: 200 mg via ORAL

## 2017-02-23 MED ORDER — CIPROFLOXACIN HCL 500 MG PO TABS: 500.0000 mg | ORAL_TABLET | Freq: Two times a day (BID) | ORAL | 0 refills | Status: AC

## 2017-02-23 NOTE — ED Notes (Signed)
Pt with urinary frequency and dysuria. Pt denies back pain or fever. Pt states symptoms started yesterday.
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
## 2017-02-23 NOTE — ED Triage Notes (Signed)
Patient c/o frequent urination, pelvic pressure, pain/burning with urination

## 2017-02-23 NOTE — ED Notes (Signed)
Pt notified of need to recollect urine. Pt verbalizes understanding.

## 2017-09-06 ENCOUNTER — Emergency Department
Admission: EM | Admit: 2017-09-06 | Discharge: 2017-09-06 | Disposition: A | Discharge status: Home / Self Care | Payer: Managed Care, Other (non HMO) | Attending: Emergency Medicine | Admitting: Emergency Medicine

## 2017-09-06 ENCOUNTER — Encounter: Payer: Self-pay | Admitting: *Deleted

## 2017-09-06 DIAGNOSIS — W57XXXA Bitten or stung by nonvenomous insect and other nonvenomous arthropods, initial encounter: Secondary | ICD-10-CM | POA: Insufficient documentation

## 2017-09-06 DIAGNOSIS — L03032 Cellulitis of left toe: Secondary | ICD-10-CM | POA: Insufficient documentation

## 2017-09-06 DIAGNOSIS — Z79899 Other long term (current) drug therapy: Secondary | ICD-10-CM | POA: Insufficient documentation

## 2017-09-06 DIAGNOSIS — F17210 Nicotine dependence, cigarettes, uncomplicated: Secondary | ICD-10-CM | POA: Insufficient documentation

## 2017-09-06 MED ORDER — IBUPROFEN 800 MG PO TABS: 800.0000 mg | ORAL_TABLET | Freq: Three times a day (TID) | ORAL | 0 refills | Status: DC | PRN

## 2017-09-06 MED ORDER — SULFAMETHOXAZOLE-TRIMETHOPRIM 800-160 MG PO TABS: 1.0000 | ORAL_TABLET | Freq: Two times a day (BID) | ORAL | 0 refills | Status: DC

## 2017-09-06 MED ORDER — POVIDONE-IODINE 10 % EX SOLN: CUTANEOUS | Status: DC | PRN

## 2017-09-06 MED ORDER — TRAMADOL HCL 50 MG PO TABS: 50.0000 mg | ORAL_TABLET | Freq: Four times a day (QID) | ORAL | 0 refills | Status: AC | PRN

## 2017-09-06 NOTE — ED Triage Notes (Signed)
Pt woke up with a insect bite to left second toe, toe is swollen and painful, pt denies any other symptoms
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
## 2017-09-06 NOTE — Discharge Instructions (Signed)
Revise Epsom salts soak twice a day for 5 minutes for 2 days.

## 2017-09-06 NOTE — ED Notes (Signed)
Left foot placed in betadine soak 
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
## 2017-11-20 ENCOUNTER — Emergency Department
Admission: EM | Admit: 2017-11-20 | Discharge: 2017-11-20 | Disposition: A | Discharge status: Home / Self Care | Payer: Managed Care, Other (non HMO) | Attending: Emergency Medicine | Admitting: Emergency Medicine

## 2017-11-20 ENCOUNTER — Encounter: Payer: Self-pay | Admitting: Emergency Medicine

## 2017-11-20 ENCOUNTER — Emergency Department: Payer: Managed Care, Other (non HMO)

## 2017-11-20 DIAGNOSIS — F17200 Nicotine dependence, unspecified, uncomplicated: Secondary | ICD-10-CM | POA: Insufficient documentation

## 2017-11-20 DIAGNOSIS — J09X2 Influenza due to identified novel influenza A virus with other respiratory manifestations: Secondary | ICD-10-CM | POA: Insufficient documentation

## 2017-11-20 DIAGNOSIS — J111 Influenza due to unidentified influenza virus with other respiratory manifestations: Secondary | ICD-10-CM

## 2017-11-20 DIAGNOSIS — Z791 Long term (current) use of non-steroidal anti-inflammatories (NSAID): Secondary | ICD-10-CM | POA: Insufficient documentation

## 2017-11-20 DIAGNOSIS — Z79899 Other long term (current) drug therapy: Secondary | ICD-10-CM | POA: Insufficient documentation

## 2017-11-20 LAB — INFLUENZA PANEL BY PCR (TYPE A & B)
Influenza A By PCR: POSITIVE — AB
Influenza B By PCR: NEGATIVE

## 2017-11-20 LAB — GROUP A STREP BY PCR: Group A Strep by PCR: NOT DETECTED

## 2017-11-20 MED ORDER — ACETAMINOPHEN 325 MG PO TABS
650.0000 mg | ORAL_TABLET | Freq: Once | ORAL | Status: AC | PRN
  Administered 2017-11-20: 650 mg via ORAL
  Filled 2017-11-20: qty 2

## 2017-11-20 NOTE — ED Triage Notes (Signed)
Pt in via POV with complaints of flu like symptoms x 3 weeks.  Pt reports onset of fever x 2 days ago.  Pt febrile upon arrival, NAD noted at this time.
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
## 2018-01-17 DIAGNOSIS — Z5321 Procedure and treatment not carried out due to patient leaving prior to being seen by health care provider: Secondary | ICD-10-CM | POA: Insufficient documentation

## 2018-01-17 DIAGNOSIS — H5712 Ocular pain, left eye: Secondary | ICD-10-CM | POA: Insufficient documentation

## 2018-01-18 ENCOUNTER — Other Ambulatory Visit: Payer: Self-pay

## 2018-01-18 ENCOUNTER — Encounter: Payer: Self-pay | Admitting: *Deleted

## 2018-01-18 ENCOUNTER — Emergency Department
Admission: EM | Admit: 2018-01-18 | Discharge: 2018-01-18 | Disposition: A | Discharge status: Home / Self Care | Payer: Self-pay | Attending: Emergency Medicine | Admitting: Emergency Medicine

## 2018-01-18 MED ORDER — FLUORESCEIN SODIUM 1 MG OP STRP: 1.0000 | ORAL_STRIP | Freq: Once | OPHTHALMIC | Status: DC

## 2018-01-18 MED ORDER — TETRACAINE HCL 0.5 % OP SOLN: 2.0000 [drp] | Freq: Once | OPHTHALMIC | Status: DC

## 2018-01-18 NOTE — ED Triage Notes (Signed)
Pt presents w/ L eyelid that is swollen and painful x 3 days, worsening over time. Pt has been using sterile saline to rinse eye, w/o relief. Pt has drainage from L eye.that is white and thick.

## 2018-02-25 ENCOUNTER — Emergency Department
Admission: EM | Admit: 2018-02-25 | Discharge: 2018-02-25 | Disposition: A | Discharge status: Home / Self Care | Payer: Self-pay | Attending: Emergency Medicine | Admitting: Emergency Medicine

## 2018-02-25 ENCOUNTER — Encounter: Payer: Self-pay | Admitting: Emergency Medicine

## 2018-02-25 ENCOUNTER — Other Ambulatory Visit: Payer: Self-pay

## 2018-02-25 DIAGNOSIS — R070 Pain in throat: Secondary | ICD-10-CM | POA: Insufficient documentation

## 2018-02-25 DIAGNOSIS — Z79899 Other long term (current) drug therapy: Secondary | ICD-10-CM | POA: Insufficient documentation

## 2018-02-25 DIAGNOSIS — F17210 Nicotine dependence, cigarettes, uncomplicated: Secondary | ICD-10-CM | POA: Insufficient documentation

## 2018-02-25 DIAGNOSIS — J069 Acute upper respiratory infection, unspecified: Secondary | ICD-10-CM | POA: Insufficient documentation

## 2018-02-25 DIAGNOSIS — R0981 Nasal congestion: Secondary | ICD-10-CM | POA: Insufficient documentation

## 2018-02-25 DIAGNOSIS — H9209 Otalgia, unspecified ear: Secondary | ICD-10-CM | POA: Insufficient documentation

## 2018-02-25 LAB — INFLUENZA PANEL BY PCR (TYPE A & B)
INFLAPCR: NEGATIVE
INFLBPCR: NEGATIVE

## 2018-02-25 LAB — GROUP A STREP BY PCR: Group A Strep by PCR: NOT DETECTED

## 2018-02-25 MED ORDER — FLUTICASONE PROPIONATE 50 MCG/ACT NA SUSP: 2.0000 | Freq: Every day | NASAL | 0 refills | Status: DC

## 2018-02-25 MED ORDER — PSEUDOEPH-BROMPHEN-DM 30-2-10 MG/5ML PO SYRP: 5.0000 mL | ORAL_SOLUTION | Freq: Four times a day (QID) | ORAL | 0 refills | Status: DC | PRN

## 2018-02-25 MED ORDER — LIDOCAINE VISCOUS 2 % MT SOLN: 10.0000 mL | OROMUCOSAL | 0 refills | Status: DC | PRN

## 2018-02-25 NOTE — ED Notes (Signed)
See triage note   Presents with sinus pressure and headache which started last week  Then developed sore throat and left ear pain 2 days ago  Unsure of fever at home   Afebrile on arrival

## 2018-02-25 NOTE — ED Triage Notes (Signed)
Pt to ed with c/o cough, congestion, sore throat and right ear pain x several days. Denies fever.
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
## 2018-03-19 ENCOUNTER — Emergency Department
Admission: EM | Admit: 2018-03-19 | Discharge: 2018-03-19 | Disposition: A | Discharge status: Home / Self Care | Payer: Worker's Compensation | Attending: Emergency Medicine | Admitting: Emergency Medicine

## 2018-03-19 ENCOUNTER — Encounter: Payer: Self-pay | Admitting: Emergency Medicine

## 2018-03-19 ENCOUNTER — Other Ambulatory Visit: Payer: Self-pay

## 2018-03-19 ENCOUNTER — Emergency Department: Payer: Worker's Compensation

## 2018-03-19 DIAGNOSIS — F17200 Nicotine dependence, unspecified, uncomplicated: Secondary | ICD-10-CM | POA: Diagnosis not present

## 2018-03-19 DIAGNOSIS — Z79899 Other long term (current) drug therapy: Secondary | ICD-10-CM | POA: Diagnosis not present

## 2018-03-19 DIAGNOSIS — M25562 Pain in left knee: Secondary | ICD-10-CM | POA: Insufficient documentation

## 2018-03-19 MED ORDER — IBUPROFEN 800 MG PO TABS
800.0000 mg | ORAL_TABLET | Freq: Once | ORAL | Status: AC
  Administered 2018-03-19: 800 mg via ORAL
  Filled 2018-03-19: qty 1

## 2018-03-19 MED ORDER — OXYCODONE-ACETAMINOPHEN 5-325 MG PO TABS
1.0000 | ORAL_TABLET | Freq: Once | ORAL | Status: AC
  Administered 2018-03-19: 1 via ORAL
  Filled 2018-03-19: qty 1

## 2018-03-19 MED ORDER — TRAMADOL HCL 50 MG PO TABS: 50.0000 mg | ORAL_TABLET | Freq: Two times a day (BID) | ORAL | 0 refills | Status: DC | PRN

## 2018-03-19 MED ORDER — NAPROXEN 500 MG PO TABS: 500.0000 mg | ORAL_TABLET | Freq: Two times a day (BID) | ORAL | 0 refills | Status: DC

## 2018-03-19 NOTE — Discharge Instructions (Addendum)
Knee immobilizer for 2-3 days as needed °
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
## 2018-03-19 NOTE — ED Triage Notes (Signed)
Presents to ED via EMS from work  S/p fall   States she tripped and twisted left knee  Was walking back from 1 building to another

## 2018-03-19 NOTE — ED Notes (Signed)
Pt is unable to urinate at this time. Pt was given a cup of water.

## 2018-03-19 NOTE — ED Notes (Addendum)
Workers comp preformed by Industrial/product designerthis tech and ambulated urine down to lab at [id number] and placed in the refrigerator.

## 2018-11-10 ENCOUNTER — Emergency Department: Payer: Self-pay

## 2018-11-10 ENCOUNTER — Inpatient Hospital Stay
Admission: EM | Admit: 2018-11-10 | Discharge: 2018-11-13 | DRG: 871 | Disposition: A | Discharge status: Home / Self Care | Payer: Self-pay | Attending: Internal Medicine | Admitting: Internal Medicine

## 2018-11-10 ENCOUNTER — Other Ambulatory Visit: Payer: Self-pay

## 2018-11-10 DIAGNOSIS — A419 Sepsis, unspecified organism: Secondary | ICD-10-CM

## 2018-11-10 DIAGNOSIS — R1011 Right upper quadrant pain: Secondary | ICD-10-CM

## 2018-11-10 DIAGNOSIS — N952 Postmenopausal atrophic vaginitis: Secondary | ICD-10-CM | POA: Diagnosis present

## 2018-11-10 DIAGNOSIS — A4151 Sepsis due to Escherichia coli [E. coli]: Principal | ICD-10-CM | POA: Diagnosis present

## 2018-11-10 DIAGNOSIS — N2889 Other specified disorders of kidney and ureter: Secondary | ICD-10-CM

## 2018-11-10 DIAGNOSIS — N10 Acute tubulo-interstitial nephritis: Secondary | ICD-10-CM | POA: Diagnosis present

## 2018-11-10 DIAGNOSIS — F329 Major depressive disorder, single episode, unspecified: Secondary | ICD-10-CM | POA: Diagnosis present

## 2018-11-10 DIAGNOSIS — N179 Acute kidney failure, unspecified: Secondary | ICD-10-CM | POA: Diagnosis present

## 2018-11-10 DIAGNOSIS — Z885 Allergy status to narcotic agent status: Secondary | ICD-10-CM

## 2018-11-10 DIAGNOSIS — Z791 Long term (current) use of non-steroidal anti-inflammatories (NSAID): Secondary | ICD-10-CM

## 2018-11-10 DIAGNOSIS — B001 Herpesviral vesicular dermatitis: Secondary | ICD-10-CM | POA: Diagnosis present

## 2018-11-10 DIAGNOSIS — N12 Tubulo-interstitial nephritis, not specified as acute or chronic: Secondary | ICD-10-CM

## 2018-11-10 DIAGNOSIS — Z800 Family history of malignant neoplasm of digestive organs: Secondary | ICD-10-CM

## 2018-11-10 DIAGNOSIS — R109 Unspecified abdominal pain: Secondary | ICD-10-CM | POA: Diagnosis present

## 2018-11-10 DIAGNOSIS — Z820 Family history of epilepsy and other diseases of the nervous system: Secondary | ICD-10-CM

## 2018-11-10 DIAGNOSIS — Z8249 Family history of ischemic heart disease and other diseases of the circulatory system: Secondary | ICD-10-CM

## 2018-11-10 DIAGNOSIS — R739 Hyperglycemia, unspecified: Secondary | ICD-10-CM | POA: Diagnosis present

## 2018-11-10 DIAGNOSIS — E876 Hypokalemia: Secondary | ICD-10-CM | POA: Diagnosis present

## 2018-11-10 DIAGNOSIS — E860 Dehydration: Secondary | ICD-10-CM | POA: Diagnosis present

## 2018-11-10 DIAGNOSIS — N151 Renal and perinephric abscess: Secondary | ICD-10-CM

## 2018-11-10 DIAGNOSIS — Z7951 Long term (current) use of inhaled steroids: Secondary | ICD-10-CM

## 2018-11-10 DIAGNOSIS — Z792 Long term (current) use of antibiotics: Secondary | ICD-10-CM

## 2018-11-10 DIAGNOSIS — Z87440 Personal history of urinary (tract) infections: Secondary | ICD-10-CM

## 2018-11-10 DIAGNOSIS — Z79899 Other long term (current) drug therapy: Secondary | ICD-10-CM

## 2018-11-10 DIAGNOSIS — F17210 Nicotine dependence, cigarettes, uncomplicated: Secondary | ICD-10-CM | POA: Diagnosis present

## 2018-11-10 DIAGNOSIS — Z90710 Acquired absence of both cervix and uterus: Secondary | ICD-10-CM

## 2018-11-10 DIAGNOSIS — Z833 Family history of diabetes mellitus: Secondary | ICD-10-CM

## 2018-11-10 DIAGNOSIS — R10A1 Flank pain, right side: Secondary | ICD-10-CM | POA: Diagnosis present

## 2018-11-10 DIAGNOSIS — Z79890 Hormone replacement therapy (postmenopausal): Secondary | ICD-10-CM

## 2018-11-10 LAB — URINALYSIS, COMPLETE (UACMP) WITH MICROSCOPIC
BILIRUBIN URINE: NEGATIVE
Glucose, UA: NEGATIVE mg/dL
KETONES UR: NEGATIVE mg/dL
NITRITE: NEGATIVE
PH: 6 (ref 5.0–8.0)
Protein, ur: 100 mg/dL — AB
RBC / HPF: >50 RBC/hpf — ABNORMAL HIGH (ref 0–5)
Specific Gravity, Urine: 1.019 (ref 1.005–1.030)

## 2018-11-10 LAB — CBC WITH DIFFERENTIAL/PLATELET
ABS IMMATURE GRANULOCYTES: 0.17 10*3/uL — AB (ref 0.00–0.07)
BASOS PCT: 0 %
Basophils Absolute: 0 10*3/uL (ref 0.0–0.1)
EOS ABS: 0.1 10*3/uL (ref 0.0–0.5)
Eosinophils Relative: 1 %
HCT: 36.3 % (ref 36.0–46.0)
Hemoglobin: 12.7 g/dL (ref 12.0–15.0)
Immature Granulocytes: 1 %
Lymphocytes Relative: 6 %
Lymphs Abs: 1.1 10*3/uL (ref 0.7–4.0)
MCH: 30.8 pg (ref 26.0–34.0)
MCHC: 35 g/dL (ref 30.0–36.0)
MCV: 88.1 fL (ref 80.0–100.0)
Monocytes Absolute: 1 10*3/uL (ref 0.1–1.0)
Monocytes Relative: 6 %
NEUTROS PCT: 86 %
NRBC: 0 % (ref 0.0–0.2)
Neutro Abs: 15.5 10*3/uL — ABNORMAL HIGH (ref 1.7–7.7)
PLATELETS: 96 10*3/uL — AB (ref 150–400)
RBC: 4.12 MIL/uL (ref 3.87–5.11)
RDW: 13.1 % (ref 11.5–15.5)
WBC: 17.8 10*3/uL — AB (ref 4.0–10.5)

## 2018-11-10 LAB — COMPREHENSIVE METABOLIC PANEL
ALBUMIN: 3.2 g/dL — AB (ref 3.5–5.0)
ALT: 48 U/L — ABNORMAL HIGH (ref 0–44)
ANION GAP: 11 (ref 5–15)
AST: 39 U/L (ref 15–41)
Alkaline Phosphatase: 82 U/L (ref 38–126)
BILIRUBIN TOTAL: 1.6 mg/dL — AB (ref 0.3–1.2)
BUN: 11 mg/dL (ref 6–20)
CALCIUM: 8.2 mg/dL — AB (ref 8.9–10.3)
CO2: 23 mmol/L (ref 22–32)
Chloride: 98 mmol/L (ref 98–111)
Creatinine, Ser: 1.13 mg/dL — ABNORMAL HIGH (ref 0.44–1.00)
GFR, EST NON AFRICAN AMERICAN: 57 mL/min — AB (ref >60)
Glucose, Bld: 204 mg/dL — ABNORMAL HIGH (ref 70–99)
POTASSIUM: 2.6 mmol/L — AB (ref 3.5–5.1)
Sodium: 132 mmol/L — ABNORMAL LOW (ref 135–145)
TOTAL PROTEIN: 6.6 g/dL (ref 6.5–8.1)

## 2018-11-10 LAB — INFLUENZA PANEL BY PCR (TYPE A & B)
INFLBPCR: NEGATIVE
Influenza A By PCR: NEGATIVE

## 2018-11-10 LAB — PREGNANCY, URINE: Preg Test, Ur: NEGATIVE

## 2018-11-10 LAB — PROTIME-INR
INR: 1.16
PROTHROMBIN TIME: 14.7 s (ref 11.4–15.2)

## 2018-11-10 LAB — CG4 I-STAT (LACTIC ACID): Lactic Acid, Venous: 1.71 mmol/L (ref 0.5–1.9)

## 2018-11-10 MED ORDER — TRAZODONE HCL 50 MG PO TABS: 25.0000 mg | ORAL_TABLET | Freq: Every evening | ORAL | Status: DC | PRN

## 2018-11-10 MED ORDER — SODIUM CHLORIDE 0.9 % IV SOLN
1.0000 g | Freq: Once | INTRAVENOUS | Status: AC
  Administered 2018-11-10: 1 g via INTRAVENOUS
  Filled 2018-11-10: qty 10

## 2018-11-10 MED ORDER — SODIUM CHLORIDE 0.9 % IV BOLUS
1000.0000 mL | Freq: Once | INTRAVENOUS | Status: AC
  Administered 2018-11-10: 1000 mL via INTRAVENOUS

## 2018-11-10 MED ORDER — ONDANSETRON HCL 4 MG/2ML IJ SOLN: 4.0000 mg | Freq: Four times a day (QID) | INTRAMUSCULAR | Status: DC | PRN

## 2018-11-10 MED ORDER — PROMETHAZINE HCL 25 MG/ML IJ SOLN
INTRAMUSCULAR | Status: AC
  Filled 2018-11-10: qty 1

## 2018-11-10 MED ORDER — MORPHINE SULFATE (PF) 2 MG/ML IV SOLN: 2.0000 mg | INTRAVENOUS | Status: DC | PRN

## 2018-11-10 MED ORDER — ONDANSETRON HCL 4 MG PO TABS: 4.0000 mg | ORAL_TABLET | Freq: Four times a day (QID) | ORAL | Status: DC | PRN

## 2018-11-10 MED ORDER — ENOXAPARIN SODIUM 40 MG/0.4ML ~~LOC~~ SOLN
40.0000 mg | SUBCUTANEOUS | Status: DC
  Filled 2018-11-10: qty 0.4

## 2018-11-10 MED ORDER — SODIUM CHLORIDE 0.9 % IV SOLN
1.0000 g | INTRAVENOUS | Status: DC
  Administered 2018-11-11 – 2018-11-12 (×2): 1 g via INTRAVENOUS
  Filled 2018-11-10: qty 1
  Filled 2018-11-10: qty 10
  Filled 2018-11-10: qty 1
  Filled 2018-11-10: qty 10

## 2018-11-10 MED ORDER — ACETAMINOPHEN 325 MG PO TABS
ORAL_TABLET | ORAL | Status: AC
  Administered 2018-11-10: 18:00:00 650 mg via ORAL
  Filled 2018-11-10: qty 2

## 2018-11-10 MED ORDER — DOCUSATE SODIUM 100 MG PO CAPS
100.0000 mg | ORAL_CAPSULE | Freq: Two times a day (BID) | ORAL | Status: DC
  Administered 2018-11-10: 100 mg via ORAL
  Filled 2018-11-10: qty 1

## 2018-11-10 MED ORDER — FLUOXETINE HCL 20 MG PO TABS
20.0000 mg | ORAL_TABLET | Freq: Every day | ORAL | Status: DC
  Filled 2018-11-10 (×2): qty 1

## 2018-11-10 MED ORDER — CALCIUM CARBONATE ANTACID 500 MG PO CHEW
1.0000 | CHEWABLE_TABLET | Freq: Once | ORAL | Status: AC
  Administered 2018-11-10: 200 mg via ORAL
  Filled 2018-11-10: qty 1

## 2018-11-10 MED ORDER — POTASSIUM CHLORIDE IN NACL 20-0.9 MEQ/L-% IV SOLN
INTRAVENOUS | Status: DC
  Administered 2018-11-10 – 2018-11-12 (×6): via INTRAVENOUS
  Filled 2018-11-10 (×10): qty 1000

## 2018-11-10 MED ORDER — SODIUM CHLORIDE 0.9 % IV SOLN: 1.0000 g | INTRAVENOUS | Status: DC

## 2018-11-10 MED ORDER — POTASSIUM CHLORIDE IN NACL 20-0.9 MEQ/L-% IV SOLN
Freq: Once | INTRAVENOUS | Status: AC
  Administered 2018-11-10: 15:00:00 via INTRAVENOUS
  Filled 2018-11-10: qty 1000

## 2018-11-10 MED ORDER — FLUTICASONE PROPIONATE 50 MCG/ACT NA SUSP
2.0000 | Freq: Every day | NASAL | Status: DC
  Filled 2018-11-10: qty 16

## 2018-11-10 MED ORDER — LIDOCAINE VISCOUS HCL 2 % MT SOLN
10.0000 mL | OROMUCOSAL | Status: DC | PRN
  Filled 2018-11-10: qty 15

## 2018-11-10 MED ORDER — ACETAMINOPHEN 325 MG PO TABS
650.0000 mg | ORAL_TABLET | Freq: Four times a day (QID) | ORAL | Status: DC | PRN
  Administered 2018-11-10 – 2018-11-12 (×7): 650 mg via ORAL
  Filled 2018-11-10 (×6): qty 2

## 2018-11-10 MED ORDER — ALPRAZOLAM 0.25 MG PO TABS: 0.2500 mg | ORAL_TABLET | Freq: Two times a day (BID) | ORAL | Status: DC | PRN

## 2018-11-10 MED ORDER — INFLUENZA VAC SPLIT QUAD 0.5 ML IM SUSY: 0.5000 mL | PREFILLED_SYRINGE | INTRAMUSCULAR | Status: DC

## 2018-11-10 MED ORDER — PROMETHAZINE HCL 25 MG/ML IJ SOLN
12.5000 mg | Freq: Four times a day (QID) | INTRAMUSCULAR | Status: DC | PRN
  Administered 2018-11-10: 12.5 mg via INTRAVENOUS

## 2018-11-10 MED ORDER — PNEUMOCOCCAL VAC POLYVALENT 25 MCG/0.5ML IJ INJ: 0.5000 mL | INJECTION | INTRAMUSCULAR | Status: DC

## 2018-11-10 MED ORDER — MORPHINE SULFATE (PF) 4 MG/ML IV SOLN
4.0000 mg | INTRAVENOUS | Status: DC | PRN
  Administered 2018-11-10 – 2018-11-12 (×5): 4 mg via INTRAVENOUS
  Filled 2018-11-10 (×5): qty 1

## 2018-11-10 MED ORDER — IOPAMIDOL (ISOVUE-300) INJECTION 61%
100.0000 mL | Freq: Once | INTRAVENOUS | Status: AC | PRN
  Administered 2018-11-10: 100 mL via INTRAVENOUS

## 2018-11-10 MED ORDER — SODIUM CHLORIDE 0.9 % IV SOLN: INTRAVENOUS | Status: DC

## 2018-11-10 NOTE — ED Notes (Signed)
ED Provider at bedside. 

## 2018-11-10 NOTE — ED Notes (Addendum)
Floor RN Candace and tech present to transport patient to room
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
## 2018-11-10 NOTE — ED Notes (Signed)
Patient transported to CT 

## 2018-11-10 NOTE — ED Notes (Signed)
Patient transported to X-ray 
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
## 2018-11-10 NOTE — ED Notes (Signed)
Date and time results received: 11/10/18 12:01 PM  (use smartphrase ".now" to insert current time)  Test: potassium Critical Value: 2.6  Name of Provider Notified: Roxan Hockeyobinson  Orders Received? Or Actions Taken?: no orders at this time.

## 2018-11-10 NOTE — ED Notes (Signed)
Patient c/o fever, body aches, chills, and upper abd pain X 2 days. Abd very tender to touch in RUQ

## 2018-11-10 NOTE — ED Notes (Signed)
Floor aware need to come get pt from ED

## 2018-11-10 NOTE — ED Triage Notes (Signed)
Pt c/o vomiting and diarrhea with bodyaches and fever for the past 2 days.

## 2018-11-10 NOTE — ED Notes (Signed)
Patient transported to Ultrasound 

## 2018-11-10 NOTE — ED Notes (Signed)
Patient unhooked from monitor and assisted to bathroom
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
## 2018-11-11 DIAGNOSIS — N151 Renal and perinephric abscess: Secondary | ICD-10-CM

## 2018-11-11 LAB — CBC
HCT: 33.2 % — ABNORMAL LOW (ref 36.0–46.0)
HEMOGLOBIN: 11.1 g/dL — AB (ref 12.0–15.0)
MCH: 30.3 pg (ref 26.0–34.0)
MCHC: 33.4 g/dL (ref 30.0–36.0)
MCV: 90.7 fL (ref 80.0–100.0)
Platelets: 87 10*3/uL — ABNORMAL LOW (ref 150–400)
RBC: 3.66 MIL/uL — ABNORMAL LOW (ref 3.87–5.11)
RDW: 13.5 % (ref 11.5–15.5)
WBC: 12 10*3/uL — ABNORMAL HIGH (ref 4.0–10.5)
nRBC: 0 % (ref 0.0–0.2)

## 2018-11-11 LAB — BASIC METABOLIC PANEL
Anion gap: 3 — ABNORMAL LOW (ref 5–15)
BUN: 8 mg/dL (ref 6–20)
CO2: 24 mmol/L (ref 22–32)
Calcium: 7.7 mg/dL — ABNORMAL LOW (ref 8.9–10.3)
Chloride: 111 mmol/L (ref 98–111)
Creatinine, Ser: 0.8 mg/dL (ref 0.44–1.00)
GFR calc Af Amer: >60 mL/min (ref >60)
GFR calc non Af Amer: >60 mL/min (ref >60)
Glucose, Bld: 117 mg/dL — ABNORMAL HIGH (ref 70–99)
Potassium: 3.2 mmol/L — ABNORMAL LOW (ref 3.5–5.1)
Sodium: 138 mmol/L (ref 135–145)

## 2018-11-11 LAB — MAGNESIUM: Magnesium: 1.8 mg/dL (ref 1.7–2.4)

## 2018-11-11 LAB — GLUCOSE, CAPILLARY: Glucose-Capillary: 165 mg/dL — ABNORMAL HIGH (ref 70–99)

## 2018-11-11 MED ORDER — POTASSIUM CHLORIDE 10 MEQ/100ML IV SOLN
10.0000 meq | INTRAVENOUS | Status: DC
  Filled 2018-11-11: qty 100

## 2018-11-11 MED ORDER — POTASSIUM CHLORIDE CRYS ER 20 MEQ PO TBCR
40.0000 meq | EXTENDED_RELEASE_TABLET | Freq: Once | ORAL | Status: AC
  Administered 2018-11-11: 15:00:00 40 meq via ORAL
  Filled 2018-11-11: qty 2

## 2018-11-11 MED ORDER — CALCIUM CARBONATE ANTACID 500 MG PO CHEW
1.0000 | CHEWABLE_TABLET | Freq: Once | ORAL | Status: AC
  Administered 2018-11-11: 13:00:00 200 mg via ORAL
  Filled 2018-11-11: qty 1

## 2018-11-11 MED ORDER — MAGNESIUM SULFATE IN D5W 1-5 GM/100ML-% IV SOLN
1.0000 g | Freq: Once | INTRAVENOUS | Status: AC
  Administered 2018-11-11: 1 g via INTRAVENOUS
  Filled 2018-11-11: qty 100

## 2018-11-11 NOTE — Progress Notes (Signed)
[facility] [place] at [facility]   PATIENT NAME: Tara Bray    MR#:  [id number]  DATE OF BIRTH:  Jan 15, 1971  SUBJECTIVE:   States she is feeling little bit better today.  Still having some right flank pain, but it has improved from yesterday.  She endorses feeling feverish overnight and had to low-grade fevers to 100.8.  Denies any chills, nausea, vomiting.  REVIEW OF SYSTEMS:  Review of Systems  Constitutional: Positive for fever. Negative for chills.  HENT: Negative for congestion and sore throat.   Eyes: Negative for blurred vision and double vision.  Respiratory: Negative for cough and shortness of breath.   Cardiovascular: Negative for chest pain, palpitations and leg swelling.  Gastrointestinal: Positive for abdominal pain. Negative for nausea and vomiting.  Genitourinary: Positive for dysuria, flank pain, frequency and urgency.  Musculoskeletal: Negative for back pain and neck pain.  Neurological: Negative for dizziness and headaches.  Psychiatric/Behavioral: Negative for depression. The patient is not nervous/anxious.    DRUG ALLERGIES:   Allergies  Allergen Reactions  . Vicodin [Hydrocodone-Acetaminophen] Nausea And Vomiting   VITALS:  Blood pressure (!) 110/56, pulse (!) 102, temperature (!) 102.1 F (38.9 C), temperature source Oral, resp. rate 20, height 5\' 1"  (1.549 m), weight 75.9 kg, SpO2 100 %. PHYSICAL EXAMINATION:  Physical Exam  GENERAL:  47 y.o.-year-old patient lying in the bed with no acute distress.  EYES: Pupils equal, round, reactive to light and accommodation. No scleral icterus. Extraocular muscles intact.  HEENT: Head atraumatic, normocephalic. Oropharynx and nasopharynx clear.  NECK:  Supple, no jugular venous distention. No thyroid enlargement, no tenderness.  LUNGS: Normal breath sounds bilaterally, no wheezing, rales,rhonchi or crepitation. No use of accessory muscles of respiration.  CARDIOVASCULAR: Tachycardic,  regular rhythm, S1, S2 normal. No murmurs, rubs, or gallops.  ABDOMEN: Soft, nondistended. Bowel sounds present. No organomegaly or mass.  +right CVA tenderness, + suprapubic tenderness. EXTREMITIES: No pedal edema, cyanosis, or clubbing.  NEUROLOGIC: Cranial nerves II through XII are intact. Muscle strength 5/5 in all extremities. Sensation intact. Gait not checked.  PSYCHIATRIC: The patient is alert and oriented x 3.  SKIN: No obvious rash, lesion, or ulcer.  LABORATORY PANEL:  Female CBC Recent Labs  Lab 11/11/18 0433  WBC 12.0*  HGB 11.1*  HCT 33.2*  PLT 87*   ------------------------------------------------------------------------------------------------------------------ Chemistries  Recent Labs  Lab 11/10/18 1105 11/11/18 0433  NA 132* 138  K 2.6* 3.2*  CL 98 111  CO2 23 24  GLUCOSE 204* 117*  BUN 11 8  CREATININE 1.13* 0.80  CALCIUM 8.2* 7.7*  MG  --  1.8  AST 39  --   ALT 48*  --   ALKPHOS 82  --   BILITOT 1.6*  --    RADIOLOGY:  Ct Abdomen Pelvis W Contrast  Result Date: 11/10/2018 CLINICAL DATA:  Acute generalized abdominal pain EXAM: CT ABDOMEN AND PELVIS WITH CONTRAST TECHNIQUE: Multidetector CT imaging of the abdomen and pelvis was performed using the standard protocol following bolus administration of intravenous contrast. Sagittal and coronal MPR images reconstructed from axial data set. CONTRAST:  ISOVUE-300 IOPAMIDOL (ISOVUE-300) INJECTION 61% IV. No oral contrast. COMPARISON:  Earlier noncontrast CT abdomen and pelvis of 11/10/2018 FINDINGS: Lower chest: Bibasilar atelectasis and small pleural effusions. Probable subpleural bleb RIGHT lower lobe. Hepatobiliary: Minimal fatty infiltration of liver adjacent to falciform fissure. Gallbladder and liver otherwise normal appearance. Pancreas: Normal appearance Spleen: Normal appearance Adrenals/Urinary Tract: Tiny cyst upper pole LEFT kidney.  Adrenal glands, LEFT kidney, LEFT ureter, and bladder normal  appearance. RIGHT kidney appears mildly enlarged with patchy areas of decreased attenuation within the nephrogram which could represent pyelonephritis. Again identified subcapsular collection at the lateral aspect of the mid RIGHT kidney, extending inferiorly to the inferior pole, measuring up to 12 mm thick question subcapsular hematoma based on attenuation on earlier noncontrast CT. Again identified area of question masslike enlargement at the mid RIGHT kidney, 3.3 x 2.6 x 2.4 cm, could represent focal pyelonephritis with developing renal abscess or a renal neoplasm. Mild scattered RIGHT perinephric edema. No hydronephrosis or urinary tract calcification. Stomach/Bowel: Normal appendix containing a tiny appendicular. Stomach and bowel loops otherwise normal appearance. Vascular/Lymphatic: Atherosclerotic calcifications aorta and iliac arteries without aneurysm. LEFT pelvic phlebolith. Scattered normal sized retroperitoneal and mesenteric lymph nodes Reproductive: Uterus surgically absent. Normal appearance LEFT ovary with again identified small RIGHT ovarian cyst. Other: No free air or free fluid. Small umbilical hernia containing fat. Musculoskeletal: No acute osseous findings. IMPRESSION: Patchy areas within the RIGHT nephrogram with surrounding perinephric edema favoring pyelonephritis. Persistent visualization of a small subcapsular hematoma at the lateral aspect of the mid RIGHT kidney extending to inferior pole. 3.3 x 2.6 x 2.4 cm in diameter mass versus developing renal abscess at the mid RIGHT kidney. Subcapsular hematoma is not a common complication of pyelonephritis, and renal neoplasm remains a diagnostic consideration. Recommend correlation with urinalysis and follow-up imaging including potential MR until resolution of the questionable mass lesion is seen in order to exclude renal neoplasm and renal abscess. Electronically Signed   By: Ulyses SouthwardMark  Boles M.D.   On: 11/10/2018 14:49   ASSESSMENT AND PLAN:     Sepsis secondary to right pyelonephritis with right renal lesion concerning for abscess versus mass- tachycardia, hypotension, leukocytosis improving.  Patient continues to spike low-grade fevers. -Continue ceftriaxone -Follow-up blood and urine cultures -Urology following-recommending 4 weeks of antibiotics with repeat imaging after completing antibiotic course -If no improvement, will ask IR to drain suspected abscess -Continue IV fluids, IV pain meds, IV antiemetics   Severe hypokalemia secondary to GI losses- improved. -Replete -Recheck potassium in the morning  Acute renal failure-resolved with IV fluids -Continue IV fluids for today -Monitor creatinine  Sinus tachycardia-improving with fluids, but still remains mildly tachycardic -Continue IV fluids -Will add TSH onto morning labs  All the records are reviewed and case discussed with Care Management/Social Worker. Management plans discussed with the patient, family and they are in agreement.  CODE STATUS: Full Code  TOTAL TIME TAKING CARE OF THIS PATIENT: 35 minutes.   More than 50% of the time was spent in counseling/coordination of care: YES  POSSIBLE D/C IN 1-2 DAYS, DEPENDING ON CLINICAL CONDITION.   Jinny BlossomKaty D  M.D on 11/11/2018 at 1:35 PM  Between 7am to 6pm - Pager - [phone]  After 6pm go to [email or web address] - Social research officer, [email or web address] [email or web address]  [facility]  Office  [phone]  CC: Primary care physician; Patient, No Pcp Per  Note: This dictation was prepared with Dragon dictation along with smaller phrase technology. Any transcriptional errors that result from this process are unintentional.

## 2018-11-11 NOTE — Progress Notes (Signed)
Urology Consult Follow Up  Subjective: Fever to 103.1 yesterday.  Lower grade temps this AM bu spiked high temp to 102.1 at the time of rounds.  UCx/ blood cultures negative to date.    Anti-infectives: Anti-infectives (From admission, onward)   Start     Dose/Rate Route Frequency Ordered Stop   11/11/18 1300  cefTRIAXone (ROCEPHIN) 1 g in sodium chloride 0.9 % 100 mL IVPB     1 g 200 mL/hr over 30 Minutes Intravenous Every 24 hours 11/10/18 1620     11/11/18 0000  cefTRIAXone (ROCEPHIN) 1 g in sodium chloride 0.9 % 100 mL IVPB  Status:  Discontinued     1 g 200 mL/hr over 30 Minutes Intravenous Every 24 hours 11/10/18 1620 11/10/18 1721   11/10/18 1230  cefTRIAXone (ROCEPHIN) 1 g in sodium chloride 0.9 % 100 mL IVPB     1 g 200 mL/hr over 30 Minutes Intravenous  Once 11/10/18 1225 11/10/18 1353      Current Facility-Administered Medications  Medication Dose Route Frequency Provider Last Rate Last Dose  . 0.9 % NaCl with KCl 20 mEq/ L  infusion   Intravenous Continuous Katha Hamming, MD 100 mL/hr at 11/11/18 1128    . acetaminophen (TYLENOL) tablet 650 mg  650 mg Oral Q6H PRN Katha Hamming, MD   650 mg at 11/11/18 0656  . ALPRAZolam Prudy Feeler) tablet 0.25 mg  0.25 mg Oral BID PRN Katha Hamming, MD      . calcium carbonate (TUMS - dosed in mg elemental calcium) chewable tablet 200 mg of elemental calcium  1 tablet Oral Once Mayo, Allyn Kenner, MD      . cefTRIAXone (ROCEPHIN) 1 g in sodium chloride 0.9 % 100 mL IVPB  1 g Intravenous Q24H Katha Hamming, MD      . enoxaparin (LOVENOX) injection 40 mg  40 mg Subcutaneous Q24H Katha Hamming, MD      . Influenza vac split quadrivalent PF (FLUARIX) injection 0.5 mL  0.5 mL Intramuscular Tomorrow-1000 Katha Hamming, MD      . lidocaine (XYLOCAINE) 2 % viscous mouth solution 10 mL  10 mL Mouth/Throat PRN Katha Hamming, MD      . magnesium sulfate IVPB 1 g 100 mL  1 g Intravenous Once Mayo, Allyn Kenner, MD       . morphine 4 MG/ML injection 4 mg  4 mg Intravenous Q3H PRN Willy Eddy, MD   4 mg at 11/11/18 0545  . ondansetron (ZOFRAN) tablet 4 mg  4 mg Oral Q6H PRN Katha Hamming, MD       Or  . ondansetron (ZOFRAN) injection 4 mg  4 mg Intravenous Q6H PRN Katha Hamming, MD      . pneumococcal 23 valent vaccine (PNU-IMMUNE) injection 0.5 mL  0.5 mL Intramuscular Tomorrow-1000 Katha Hamming, MD      . potassium chloride SA (K-DUR,KLOR-CON) CR tablet 40 mEq  40 mEq Oral Once Mayo, Allyn Kenner, MD      . promethazine (PHENERGAN) injection 12.5 mg  12.5 mg Intravenous Q6H PRN Willy Eddy, MD   12.5 mg at 11/10/18 1134  . traZODone (DESYREL) tablet 25 mg  25 mg Oral QHS PRN Katha Hamming, MD         Objective: Vital signs in last 24 hours: Temp:  [99.9 F (37.7 C)-103.1 F (39.5 C)] 100.4 F (38 C) (12/10 0454) Pulse Rate:  [95-114] 99 (12/10 0419) Resp:  [13-20] 20 (12/10 0419) BP: (91-110)/(47-69) 110/69 (12/10 0419) SpO2:  [95 %-98 %]  98 % (12/10 0419) Weight:  [75.9 kg] 75.9 kg (12/10 0625)  Intake/Output from previous day: 12/09 0701 - 12/10 0700 In: 1233.3 [I.V.:1133.3; IV Piggyback:100] Out: -  Intake/Output this shift: No intake/output data recorded.   Physical Exam  Constitutional: She appears well-developed and well-nourished.  Pulmonary/Chest: Effort normal. No respiratory distress.  Abdominal: Soft.  Genitourinary:  Genitourinary Comments: Mild right CVA tenderness  Skin: Skin is warm and dry.  Psychiatric: She has a normal mood and affect. Her behavior is normal.    Lab Results:  Recent Labs    11/10/18 1105 11/11/18 0433  WBC 17.8* 12.0*  HGB 12.7 11.1*  HCT 36.3 33.2*  PLT 96* 87*   BMET Recent Labs    11/10/18 1105 11/11/18 0433  NA 132* 138  K 2.6* 3.2*  CL 98 111  CO2 23 24  GLUCOSE 204* 117*  BUN 11 8  CREATININE 1.13* 0.80  CALCIUM 8.2* 7.7*   PT/INR Recent Labs    11/10/18 1105  LABPROT 14.7  INR  1.16   ABG No results for input(s): PHART, HCO3 in the last 72 hours.  Invalid input(s): PCO2, PO2  Studies/Results: Dg Chest 2 View  Result Date: 11/10/2018 CLINICAL DATA:  Cough and fever EXAM: CHEST - 2 VIEW COMPARISON:  November 20, 2017 FINDINGS: There is patchy bibasilar atelectasis. Lungs elsewhere are clear. The heart size and pulmonary vascularity are normal. No adenopathy. No bone lesions. IMPRESSION: Bibasilar atelectasis. Lungs elsewhere clear. No adenopathy evident. Electronically Signed   By: Bretta Bang III M.D.   On: 11/10/2018 11:00   Ct Abdomen Pelvis W Contrast  Result Date: 11/10/2018 CLINICAL DATA:  Acute generalized abdominal pain EXAM: CT ABDOMEN AND PELVIS WITH CONTRAST TECHNIQUE: Multidetector CT imaging of the abdomen and pelvis was performed using the standard protocol following bolus administration of intravenous contrast. Sagittal and coronal MPR images reconstructed from axial data set. CONTRAST:  ISOVUE-300 IOPAMIDOL (ISOVUE-300) INJECTION 61% IV. No oral contrast. COMPARISON:  Earlier noncontrast CT abdomen and pelvis of 11/10/2018 FINDINGS: Lower chest: Bibasilar atelectasis and small pleural effusions. Probable subpleural bleb RIGHT lower lobe. Hepatobiliary: Minimal fatty infiltration of liver adjacent to falciform fissure. Gallbladder and liver otherwise normal appearance. Pancreas: Normal appearance Spleen: Normal appearance Adrenals/Urinary Tract: Tiny cyst upper pole LEFT kidney. Adrenal glands, LEFT kidney, LEFT ureter, and bladder normal appearance. RIGHT kidney appears mildly enlarged with patchy areas of decreased attenuation within the nephrogram which could represent pyelonephritis. Again identified subcapsular collection at the lateral aspect of the mid RIGHT kidney, extending inferiorly to the inferior pole, measuring up to 12 mm thick question subcapsular hematoma based on attenuation on earlier noncontrast CT. Again identified area of  question masslike enlargement at the mid RIGHT kidney, 3.3 x 2.6 x 2.4 cm, could represent focal pyelonephritis with developing renal abscess or a renal neoplasm. Mild scattered RIGHT perinephric edema. No hydronephrosis or urinary tract calcification. Stomach/Bowel: Normal appendix containing a tiny appendicular. Stomach and bowel loops otherwise normal appearance. Vascular/Lymphatic: Atherosclerotic calcifications aorta and iliac arteries without aneurysm. LEFT pelvic phlebolith. Scattered normal sized retroperitoneal and mesenteric lymph nodes Reproductive: Uterus surgically absent. Normal appearance LEFT ovary with again identified small RIGHT ovarian cyst. Other: No free air or free fluid. Small umbilical hernia containing fat. Musculoskeletal: No acute osseous findings. IMPRESSION: Patchy areas within the RIGHT nephrogram with surrounding perinephric edema favoring pyelonephritis. Persistent visualization of a small subcapsular hematoma at the lateral aspect of the mid RIGHT kidney extending to inferior pole. 3.3 x  2.6 x 2.4 cm in diameter mass versus developing renal abscess at the mid RIGHT kidney. Subcapsular hematoma is not a common complication of pyelonephritis, and renal neoplasm remains a diagnostic consideration. Recommend correlation with urinalysis and follow-up imaging including potential MR until resolution of the questionable mass lesion is seen in order to exclude renal neoplasm and renal abscess. Electronically Signed   By: Ulyses Southward M.D.   On: 11/10/2018 14:49   Ct Renal Stone Study  Result Date: 11/10/2018 CLINICAL DATA:  Nausea, vomiting, diarrhea and RIGHT-side abdominal pain and chills for 2 days, flank pain, smoker EXAM: CT ABDOMEN AND PELVIS WITHOUT CONTRAST TECHNIQUE: Multidetector CT imaging of the abdomen and pelvis was performed following the standard protocol without IV contrast. Sagittal and coronal MPR images reconstructed from axial data set. No oral contrast was  administered. COMPARISON:  08/23/2012 FINDINGS: Lower chest: Subsegmental atelectasis at BILATERAL lung bases. Minimal bibasilar pleural effusions. Hepatobiliary: Gallbladder and liver normal appearance Pancreas: Normal appearance Spleen: Normal appearance Adrenals/Urinary Tract: Adrenal glands normal appearance. LEFT kidney, LEFT ureter and bladder normal appearance. RIGHT kidney appears heterogeneous in attenuation with mild perinephric edema. Crescentic high attenuation collection identified at the anterolateral aspect of the mid to upper LEFT kidney, 3.0 x 1.1 x 3.0 cm consistent with subcapsular hematoma. Similar though less defined finding is seen at the lateral aspect of the mid inferior RIGHT kidney likely represents subcapsular extension of hematoma to the inferior pole. Ill-defined area of questionable masslike enlargement at the mid kidney 3.0 x 3.4 cm, cannot exclude underlying renal neoplasm. No RIGHT hydronephrosis, ureteral calcification or ureteral dilatation. Stomach/Bowel: Normal size appendix containing a small appendicular lith. Stomach and bowel loops normal appearance. Vascular/Lymphatic: Atherosclerotic calcifications aorta without aneurysm. No adenopathy. Reproductive: Uterus surgically absent. Small cyst RIGHT ovary 2.4 x 2.1 cm. LEFT ovary unremarkable. Other: Small umbilical hernia containing fat. No free air or free fluid. Musculoskeletal: Unremarkable IMPRESSION: Minimally enlarged and heterogeneous appearing RIGHT kidney with perinephric edema and a small high attenuation crescentic subcapsular collection consistent with a subcapsular hematoma, most focal at upper pole 3.0 x 1.1 x 3.0 cm but likely extending along the lateral aspect of the RIGHT kidney to the inferior pole. Questionable underlying mass lesion at mid RIGHT kidney 3.0 x 3.4 cm; follow-up evaluation of the kidneys by MR imaging with and without contrast recommended to exclude underlying renal neoplasm. Electronically  Signed   By: Ulyses Southward M.D.   On: 11/10/2018 13:08   US Abdomen Limited Ruq  Result Date: 11/10/2018 CLINICAL DATA:  Right upper quadrant abdominal pain for 3 days. EXAM: ULTRASOUND ABDOMEN LIMITED RIGHT UPPER QUADRANT COMPARISON:  None. FINDINGS: Gallbladder: No gallstones or wall thickening visualized. No sonographic Murphy sign noted by sonographer. Common bile duct: Diameter: 2 mm, normal. Liver: No focal lesion identified. Within normal limits in parenchymal echogenicity. Portal vein is patent on color Doppler imaging with normal direction of blood flow towards the liver. Right kidney: There is increased echogenicity right renal parenchyma. No hydronephrosis. IMPRESSION: Normal appearing gallbladder and liver. Slight increased echogenicity of the right renal parenchyma suggesting renal medical disease. Right upper quadrant please pick the correct US ABDOMEN LIMITED template. Electronically Signed   By: Francene Boyers M.D.   On: 11/10/2018 11:56     Assessment: 47 year old female with early right renal abscess who continues to spike fevers but is otherwise hemodynamically stable.  Based on CT scan yesterday, lesion is not yet amenable to drainage.  Plan: -Continue IV antibiotics -Follow-up blood and  urine cultures -If she continues to spike for next 24 hours, will repeat imaging tomorrow in the form of renal ultrasound versus CT abdomen with contrast    LOS: 1 day    Tara Bray 11/11/2018

## 2018-11-12 ENCOUNTER — Telehealth: Payer: Self-pay | Admitting: Urology

## 2018-11-12 ENCOUNTER — Inpatient Hospital Stay: Payer: Self-pay

## 2018-11-12 DIAGNOSIS — N151 Renal and perinephric abscess: Secondary | ICD-10-CM

## 2018-11-12 LAB — CBC
HCT: 30.8 % — ABNORMAL LOW (ref 36.0–46.0)
Hemoglobin: 10.6 g/dL — ABNORMAL LOW (ref 12.0–15.0)
MCH: 30.6 pg (ref 26.0–34.0)
MCHC: 34.4 g/dL (ref 30.0–36.0)
MCV: 89 fL (ref 80.0–100.0)
Platelets: 115 10*3/uL — ABNORMAL LOW (ref 150–400)
RBC: 3.46 MIL/uL — ABNORMAL LOW (ref 3.87–5.11)
RDW: 13.7 % (ref 11.5–15.5)
WBC: 11.1 10*3/uL — ABNORMAL HIGH (ref 4.0–10.5)
nRBC: 0 % (ref 0.0–0.2)

## 2018-11-12 LAB — TSH: TSH: 1.306 u[IU]/mL (ref 0.350–4.500)

## 2018-11-12 LAB — HIV ANTIBODY (ROUTINE TESTING W REFLEX): HIV Screen 4th Generation wRfx: NONREACTIVE

## 2018-11-12 LAB — BASIC METABOLIC PANEL
Anion gap: 6 (ref 5–15)
BUN: 6 mg/dL (ref 6–20)
CO2: 19 mmol/L — ABNORMAL LOW (ref 22–32)
Calcium: 8.2 mg/dL — ABNORMAL LOW (ref 8.9–10.3)
Chloride: 113 mmol/L — ABNORMAL HIGH (ref 98–111)
Creatinine, Ser: 0.61 mg/dL (ref 0.44–1.00)
GFR calc non Af Amer: >60 mL/min (ref >60)
Glucose, Bld: 111 mg/dL — ABNORMAL HIGH (ref 70–99)
Potassium: 3.9 mmol/L (ref 3.5–5.1)
Sodium: 138 mmol/L (ref 135–145)

## 2018-11-12 MED ORDER — CALCIUM CARBONATE ANTACID 500 MG PO CHEW
1.0000 | CHEWABLE_TABLET | Freq: Three times a day (TID) | ORAL | Status: DC | PRN
  Administered 2018-11-12 – 2018-11-13 (×4): 200 mg via ORAL
  Filled 2018-11-12 (×4): qty 1

## 2018-11-12 MED ORDER — PANTOPRAZOLE SODIUM 40 MG PO TBEC
40.0000 mg | DELAYED_RELEASE_TABLET | Freq: Every day | ORAL | Status: DC
  Administered 2018-11-12 – 2018-11-13 (×2): 40 mg via ORAL
  Filled 2018-11-12 (×2): qty 1

## 2018-11-12 MED ORDER — VALACYCLOVIR HCL 500 MG PO TABS
1000.0000 mg | ORAL_TABLET | Freq: Two times a day (BID) | ORAL | Status: DC
  Administered 2018-11-12 – 2018-11-13 (×3): 1000 mg via ORAL
  Filled 2018-11-12 (×4): qty 2

## 2018-11-12 NOTE — Telephone Encounter (Signed)
This patient needs outpatient follow-up with me in 4 weeks with renal ultrasound just prior.  I placed the order today.  Vanna ScotlandAshley Kasy Iannacone, MD

## 2018-11-12 NOTE — Progress Notes (Signed)
Urology Consult Follow Up  Subjective: Fever curve improving.  T-max 100.8 over the past 24 hours.  White count trending down.  Urine culture growing 80,000 colonies of E. coli.  Renal ultrasound ordered and reviewed this morning, 3 cm renal mass versus abscess stable in size.  Anti-infectives: Anti-infectives (From admission, onward)   Start     Dose/Rate Route Frequency Ordered Stop   11/12/18 1000  valACYclovir (VALTREX) tablet 1,000 mg    Note to Pharmacy:  For fever blisters   1,000 mg Oral 2 times daily 11/12/18 0801     11/11/18 1300  cefTRIAXone (ROCEPHIN) 1 g in sodium chloride 0.9 % 100 mL IVPB     1 g 200 mL/hr over 30 Minutes Intravenous Every 24 hours 11/10/18 1620     11/11/18 0000  cefTRIAXone (ROCEPHIN) 1 g in sodium chloride 0.9 % 100 mL IVPB  Status:  Discontinued     1 g 200 mL/hr over 30 Minutes Intravenous Every 24 hours 11/10/18 1620 11/10/18 1721   11/10/18 1230  cefTRIAXone (ROCEPHIN) 1 g in sodium chloride 0.9 % 100 mL IVPB     1 g 200 mL/hr over 30 Minutes Intravenous  Once 11/10/18 1225 11/10/18 1353      Current Facility-Administered Medications  Medication Dose Route Frequency Provider Last Rate Last Dose  . 0.9 % NaCl with KCl 20 mEq/ L  infusion   Intravenous Continuous Katha HammingKonidena, Snehalatha, MD 100 mL/hr at 11/12/18 0752    . acetaminophen (TYLENOL) tablet 650 mg  650 mg Oral Q6H PRN Katha HammingKonidena, Snehalatha, MD   650 mg at 11/12/18 1302  . ALPRAZolam Prudy Feeler(XANAX) tablet 0.25 mg  0.25 mg Oral BID PRN Katha HammingKonidena, Snehalatha, MD      . calcium carbonate (TUMS - dosed in mg elemental calcium) chewable tablet 200 mg of elemental calcium  1 tablet Oral TID PRN Mayo, Allyn KennerKaty Dodd, MD   200 mg of elemental calcium at 11/12/18 1427  . cefTRIAXone (ROCEPHIN) 1 g in sodium chloride 0.9 % 100 mL IVPB  1 g Intravenous Q24H Katha HammingKonidena, Snehalatha, MD 200 mL/hr at 11/12/18 1330 1 g at 11/12/18 1330  . enoxaparin (LOVENOX) injection 40 mg  40 mg Subcutaneous Q24H Katha HammingKonidena, Snehalatha,  MD      . Influenza vac split quadrivalent PF (FLUARIX) injection 0.5 mL  0.5 mL Intramuscular Tomorrow-1000 Katha HammingKonidena, Snehalatha, MD      . lidocaine (XYLOCAINE) 2 % viscous mouth solution 10 mL  10 mL Mouth/Throat PRN Katha HammingKonidena, Snehalatha, MD      . morphine 4 MG/ML injection 4 mg  4 mg Intravenous Q3H PRN Willy Eddyobinson, Patrick, MD   4 mg at 11/12/18 1422  . ondansetron (ZOFRAN) tablet 4 mg  4 mg Oral Q6H PRN Katha HammingKonidena, Snehalatha, MD       Or  . ondansetron (ZOFRAN) injection 4 mg  4 mg Intravenous Q6H PRN Katha HammingKonidena, Snehalatha, MD      . pantoprazole (PROTONIX) EC tablet 40 mg  40 mg Oral Daily Mayo, Allyn KennerKaty Dodd, MD   40 mg at 11/12/18 1142  . pneumococcal 23 valent vaccine (PNU-IMMUNE) injection 0.5 mL  0.5 mL Intramuscular Tomorrow-1000 Katha HammingKonidena, Snehalatha, MD      . promethazine (PHENERGAN) injection 12.5 mg  12.5 mg Intravenous Q6H PRN Willy Eddyobinson, Patrick, MD   12.5 mg at 11/10/18 1134  . traZODone (DESYREL) tablet 25 mg  25 mg Oral QHS PRN Katha HammingKonidena, Snehalatha, MD      . valACYclovir (VALTREX) tablet 1,000 mg  1,000 mg Oral BID  Mayo, Allyn Kenner, MD   1,000 mg at 11/12/18 1144     Objective: Vital signs in last 24 hours: Temp:  [98.4 F (36.9 C)-100.8 F (38.2 C)] 99.6 F (37.6 C) (12/11 0356) Pulse Rate:  [91-101] 91 (12/11 0356) Resp:  [17-18] 17 (12/11 0356) BP: (119-126)/(67-78) 119/67 (12/11 0356) SpO2:  [97 %] 97 % (12/11 0356)  Intake/Output from previous day: 12/10 0701 - 12/11 0700 In: 1058.3 [I.V.:958.3; IV Piggyback:100] Out: -  Intake/Output this shift: No intake/output data recorded.   Physical Exam  Constitutional: She appears well-developed and well-nourished.  Pulmonary/Chest: Effort normal. No respiratory distress.  Abdominal: Soft.  Genitourinary:  Genitourinary Comments: Mild right CVA tenderness  Skin: Skin is warm and dry.  Psychiatric: She has a normal mood and affect. Her behavior is normal.    Lab Results:  Recent Labs    11/11/18 0433  11/12/18 0643  WBC 12.0* 11.1*  HGB 11.1* 10.6*  HCT 33.2* 30.8*  PLT 87* 115*   BMET Recent Labs    11/11/18 0433 11/12/18 0643  NA 138 138  K 3.2* 3.9  CL 111 113*  CO2 24 19*  GLUCOSE 117* 111*  BUN 8 6  CREATININE 0.80 0.61  CALCIUM 7.7* 8.2*   PT/INR Recent Labs    11/10/18 1105  LABPROT 14.7  INR 1.16   ABG No results for input(s): PHART, HCO3 in the last 72 hours.  Invalid input(s): PCO2, PO2  Studies/Results: US Renal  Result Date: 11/12/2018 CLINICAL DATA:  Renal abscess seen on CT EXAM: RENAL / URINARY TRACT ULTRASOUND COMPLETE COMPARISON:  11/10/2018 FINDINGS: Right Kidney: Renal measurements: 13.1 x 7.6 x 4.3 cm = volume: 224 mL. Complex area in the midpole measures 3.3 x 2.5 x 2.4 cm and likely corresponds to the area of previously seen focal pyelonephritis/developing abscess. 1.4 cm midpole echogenic area may reflect angiomyolipoma or invaginated pararenal fat. Small amount of perinephric fluid. No hydronephrosis. Left Kidney: Renal measurements: 12.6 x 6.6 x 5.4 cm. = volume: 235 mL. 8 mm cyst in the upper pole. No mass or hydronephrosis. Bladder: Appears normal for degree of bladder distention. IMPRESSION: Continued complex area within the midpole of the right kidney measures up to 3.3 cm and is stable when compared to recent CT. Stable small amount of perinephric fluid on the right. No hydronephrosis. Electronically Signed   By: Charlett Nose M.D.   On: 11/12/2018 10:19   Renal ultrasound was personally reviewed today.  Assessment: 47 year old female with early right renal abscess which is stable in size, improving fever curve and downward trending WBC.  Plan: -Continue IV antibiotics with transition to oral antibiotics prolonged course, 4 weeks -Follow-up blood and urine cultures -When deemed appropriate for discharge as long as fever curve continues to improve, she will follow-up with urology in 4 weeks with a repeat renal ultrasound prior to the  visit.  At this point time, urology will sign off.  Please contact us if she spikes another high-grade fever or status changes.    LOS: 2 days    Tara Bray 11/12/2018

## 2018-11-12 NOTE — Progress Notes (Addendum)
[facility] [place] [facility]   PATIENT NAME: Tara Bray    MR#:  [id number]  DATE OF BIRTH:  July 02, 1971  SUBJECTIVE:   Patient has continued having fevers up to 102.66F over the last 24 hours.  She states she is overall feeling little bit better.  Still having some suprapubic and right flank pain.  Also notes that she has started developing "fever blisters" on her face.  REVIEW OF SYSTEMS:  Review of Systems  Constitutional: Positive for fever. Negative for chills.  HENT: Negative for congestion and sore throat.   Eyes: Negative for blurred vision and double vision.  Respiratory: Negative for cough and shortness of breath.   Cardiovascular: Negative for chest pain, palpitations and leg swelling.  Gastrointestinal: Positive for abdominal pain. Negative for nausea and vomiting.  Genitourinary: Positive for dysuria, flank pain, frequency and urgency.  Musculoskeletal: Negative for back pain and neck pain.  Neurological: Negative for dizziness and headaches.  Psychiatric/Behavioral: Negative for depression. The patient is not nervous/anxious.    DRUG ALLERGIES:   Allergies  Allergen Reactions  . Vicodin [Hydrocodone-Acetaminophen] Nausea And Vomiting   VITALS:  Blood pressure 119/67, pulse 91, temperature 99.6 F (37.6 C), temperature source Oral, resp. rate 17, height 5\' 1"  (1.549 m), weight 75.9 kg, SpO2 97 %. PHYSICAL EXAMINATION:  Physical Exam  GENERAL:  47 y.o.-year-old patient sitting up in chair with no acute distress.  EYES: Pupils equal, round, reactive to light and accommodation. No scleral icterus. Extraocular muscles intact.  HEENT: Head atraumatic, normocephalic. Oropharynx and nasopharynx clear. + Pustules present in the left corner of the mouth and around the left eye. NECK:  Supple, no jugular venous distention. No thyroid enlargement, no tenderness.  LUNGS: Normal breath sounds bilaterally, no wheezing, rales,rhonchi or  crepitation. No use of accessory muscles of respiration.  CARDIOVASCULAR: RRR, S1, S2 normal. No murmurs, rubs, or gallops.  ABDOMEN: Soft, nondistended. Bowel sounds present. No organomegaly or mass.  +right CVA tenderness, + mild suprapubic tenderness. EXTREMITIES: No pedal edema, cyanosis, or clubbing.  NEUROLOGIC: Cranial nerves II through XII are intact. Muscle strength 5/5 in all extremities. Sensation intact. Gait not checked.  PSYCHIATRIC: The patient is alert and oriented x 3.  SKIN: No obvious rash, lesion, or ulcer.  LABORATORY PANEL:  Female CBC Recent Labs  Lab 11/12/18 0643  WBC 11.1*  HGB 10.6*  HCT 30.8*  PLT 115*   ------------------------------------------------------------------------------------------------------------------ Chemistries  Recent Labs  Lab 11/10/18 1105 11/11/18 0433 11/12/18 0643  NA 132* 138 138  K 2.6* 3.2* 3.9  CL 98 111 113*  CO2 23 24 19*  GLUCOSE 204* 117* 111*  BUN 11 8 6   CREATININE 1.13* 0.80 0.61  CALCIUM 8.2* 7.7* 8.2*  MG  --  1.8  --   AST 39  --   --   ALT 48*  --   --   ALKPHOS 82  --   --   BILITOT 1.6*  --   --    RADIOLOGY:  US Renal  Result Date: 11/12/2018 CLINICAL DATA:  Renal abscess seen on CT EXAM: RENAL / URINARY TRACT ULTRASOUND COMPLETE COMPARISON:  11/10/2018 FINDINGS: Right Kidney: Renal measurements: 13.1 x 7.6 x 4.3 cm = volume: 224 mL. Complex area in the midpole measures 3.3 x 2.5 x 2.4 cm and likely corresponds to the area of previously seen focal pyelonephritis/developing abscess. 1.4 cm midpole echogenic area may reflect angiomyolipoma or invaginated pararenal fat. Small amount of perinephric fluid. No  hydronephrosis. Left Kidney: Renal measurements: 12.6 x 6.6 x 5.4 cm. = volume: 235 mL. 8 mm cyst in the upper pole. No mass or hydronephrosis. Bladder: Appears normal for degree of bladder distention. IMPRESSION: Continued complex area within the midpole of the right kidney measures up to 3.3 cm and is  stable when compared to recent CT. Stable small amount of perinephric fluid on the right. No hydronephrosis. Electronically Signed   By: Charlett NoseKevin  Dover M.D.   On: 11/12/2018 10:19   ASSESSMENT AND PLAN:   Sepsis secondary to right pyelonephritis with right renal lesion concerning for abscess versus mass- still spiking fevers over the last 24 hours. -Will obtain renal US today to make sure abscess is not enlarging -Continue ceftriaxone -Follow-up blood and urine cultures -Urology following-recommending 4 weeks of antibiotics with repeat imaging after completing antibiotic course -Continue IV fluids, IV pain meds, IV antiemetics  Fever blisters- developed on the mouth and near the left eye. Patient has had these in the past. -Start valtrex 1g bid  Sinus tachycardia- resolved with fluids. TSH normal. -Monitor  Hyperglycemia- no history of diabetes -Check a1c  All the records are reviewed and case discussed with Care Management/Social Worker. Management plans discussed with the patient, family and they are in agreement.  CODE STATUS: Full Code  TOTAL TIME TAKING CARE OF THIS PATIENT: 35 minutes.   More than 50% of the time was spent in counseling/coordination of care: YES  POSSIBLE D/C tomorrow, DEPENDING ON CLINICAL CONDITION.   Jinny BlossomKaty D Montasia Chisenhall M.D on 11/12/2018 at 12:53 PM  Between 7am to 6pm - Pager - [phone]  After 6pm go to [email or web address] - Social research officer, governmentpassword [email or web address] [email or web address]  [facility]  Office  [phone]  CC: Primary care physician; Patient, No Pcp Per  Note: This dictation was prepared with Dragon dictation along with smaller phrase technology. Any transcriptional errors that result from this process are unintentional.

## 2018-11-13 LAB — BASIC METABOLIC PANEL
Anion gap: 6 (ref 5–15)
BUN: 6 mg/dL (ref 6–20)
CHLORIDE: 111 mmol/L (ref 98–111)
CO2: 19 mmol/L — ABNORMAL LOW (ref 22–32)
Calcium: 8.4 mg/dL — ABNORMAL LOW (ref 8.9–10.3)
Creatinine, Ser: 0.64 mg/dL (ref 0.44–1.00)
GFR calc Af Amer: >60 mL/min (ref >60)
GFR calc non Af Amer: >60 mL/min (ref >60)
Glucose, Bld: 115 mg/dL — ABNORMAL HIGH (ref 70–99)
Potassium: 4 mmol/L (ref 3.5–5.1)
Sodium: 136 mmol/L (ref 135–145)

## 2018-11-13 LAB — CBC
HCT: 30.7 % — ABNORMAL LOW (ref 36.0–46.0)
Hemoglobin: 10.6 g/dL — ABNORMAL LOW (ref 12.0–15.0)
MCH: 30.7 pg (ref 26.0–34.0)
MCHC: 34.5 g/dL (ref 30.0–36.0)
MCV: 89 fL (ref 80.0–100.0)
NRBC: 0 % (ref 0.0–0.2)
Platelets: 146 10*3/uL — ABNORMAL LOW (ref 150–400)
RBC: 3.45 MIL/uL — ABNORMAL LOW (ref 3.87–5.11)
RDW: 14.2 % (ref 11.5–15.5)
WBC: 10.9 10*3/uL — ABNORMAL HIGH (ref 4.0–10.5)

## 2018-11-13 LAB — URINE CULTURE: Culture: 80000 — AB

## 2018-11-13 LAB — HEMOGLOBIN A1C
Hgb A1c MFr Bld: 5.5 % (ref 4.8–5.6)
MEAN PLASMA GLUCOSE: 111.15 mg/dL

## 2018-11-13 MED ORDER — VALACYCLOVIR HCL 1 G PO TABS: 1000.0000 mg | ORAL_TABLET | Freq: Two times a day (BID) | ORAL | 0 refills | Status: AC

## 2018-11-13 MED ORDER — SULFAMETHOXAZOLE-TRIMETHOPRIM 400-80 MG PO TABS: 1.0000 | ORAL_TABLET | Freq: Two times a day (BID) | ORAL | 0 refills | Status: AC

## 2018-11-13 NOTE — Care Management Note (Signed)
Case Management Note  Patient Details  Name: Tara Bray MRN: [id number] Date of Birth: 08/14/1971  Subjective/Objective:   Admitted to [facility] with the diagnosis of right flank pain. No insurance. No primary care physician.  [place] in [place] Works for a [employer].   Telephone number [phone]                Action/Plan: Application for [facility] + Medication Management given.  Will send Smiley HousemanLorrie Carter at [facility] referral    Expected Discharge Date:  11/13/18               Expected Discharge Plan:     In-House Referral:   yes  Discharge planning Services    yes Post Acute Care Choice:    Choice offered to:     DME Arranged:    DME Agency:     HH Arranged:    HH Agency:     Status of Service:     If discussed at [employer] Meetings, dates discussed:    Additional Comments:  Gwenette GreetBrenda S Kristelle Cavallaro, RN MSN CCM Care Management [phone] 11/13/2018, 10:21 AM

## 2018-11-13 NOTE — Discharge Instructions (Signed)
It was so nice to meet you during this hospitalization!  We found that you had kidney infection. You may also have a small abscess on your kidney. The abscess was not large enough to be drained and will likely get better on its own with the antibiotics. I have prescribed Bactrim for this. Please take this twice a day for the next 4 weeks.  You will need to have another kidney ultrasound done in 4 weeks. Our radiology department will call you to get this scheduled. You will then need to follow-up with Dr. Vanna ScotlandAshley Brandon after the kidney ultrasound is done.  Take care, Dr. Nancy MarusMayo

## 2018-11-13 NOTE — Telephone Encounter (Signed)
App has been made with the discharge nurse on the floor via Juluis Mireracy   Beckett
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
## 2018-11-15 LAB — CULTURE, BLOOD (ROUTINE X 2)
Culture: NO GROWTH
Culture: NO GROWTH
SPECIAL REQUESTS: ADEQUATE

## 2018-11-17 ENCOUNTER — Telehealth: Payer: Self-pay

## 2018-11-17 NOTE — Telephone Encounter (Signed)
EMMI Follow-up: Noted on the report that the patient didn't have a scheduled follow-up appointment but one is listed on the After Visit Summary.  I called Ms. Bronkema and no voice mail was set up so unable to leave a message.  Will try again.

## 2018-11-18 ENCOUNTER — Telehealth: Payer: Self-pay

## 2018-11-18 NOTE — Telephone Encounter (Signed)
EMMI Follow-up: Second follow-up call made but no answer and no voice mail set up.  Case closed.

## 2018-12-09 ENCOUNTER — Ambulatory Visit: Payer: Self-pay | Admitting: Urology

## 2018-12-11 ENCOUNTER — Telehealth: Payer: Self-pay | Admitting: Urology

## 2018-12-11 NOTE — Telephone Encounter (Signed)
This patient was a no show to clinic this week.  It is imperative that she follow up to r/o  Underlying renal mass/ cancer.  She needs to f/u with imaging.    Please reach out to her again to reschedule and if unable to be reached, please send certified letter.    Vanna Scotland, MD

## 2018-12-16 NOTE — Telephone Encounter (Signed)
Called patient again, but her phone stated the call could not be completed She did have her RUS on 11-12-18 I will have the clinical staff send her out a certified letter   Tara Bray

## 2018-12-16 NOTE — Telephone Encounter (Signed)
Per Dr. Apolinar Junes since we have not been able to reach this patient she will need a certified letter sent to her please see the message below from Dr. Apolinar Junes.    Thanks,   Marcelino Duster

## 2018-12-17 ENCOUNTER — Encounter: Payer: Self-pay | Admitting: Family Medicine

## 2018-12-17 NOTE — Telephone Encounter (Signed)
Letter printed. And sent

## 2019-05-27 ENCOUNTER — Other Ambulatory Visit: Payer: Self-pay

## 2019-05-27 ENCOUNTER — Emergency Department
Admission: EM | Admit: 2019-05-27 | Discharge: 2019-05-27 | Disposition: A | Discharge status: Home / Self Care | Payer: Self-pay | Attending: Emergency Medicine | Admitting: Emergency Medicine

## 2019-05-27 DIAGNOSIS — F17200 Nicotine dependence, unspecified, uncomplicated: Secondary | ICD-10-CM | POA: Insufficient documentation

## 2019-05-27 DIAGNOSIS — Z79899 Other long term (current) drug therapy: Secondary | ICD-10-CM | POA: Insufficient documentation

## 2019-05-27 DIAGNOSIS — B029 Zoster without complications: Secondary | ICD-10-CM | POA: Insufficient documentation

## 2019-05-27 MED ORDER — ACYCLOVIR 400 MG PO TABS: 800.0000 mg | ORAL_TABLET | Freq: Four times a day (QID) | ORAL | 0 refills | Status: AC

## 2019-05-27 MED ORDER — OXYCODONE-ACETAMINOPHEN 5-325 MG PO TABS: 1.0000 | ORAL_TABLET | Freq: Four times a day (QID) | ORAL | 0 refills | Status: DC | PRN

## 2019-05-27 MED ORDER — ACETAMINOPHEN 325 MG PO TABS
650.0000 mg | ORAL_TABLET | Freq: Once | ORAL | Status: AC
  Administered 2019-05-27: 650 mg via ORAL
  Filled 2019-05-27: qty 2

## 2019-05-27 NOTE — Discharge Instructions (Addendum)
Call make an appointment with Dr. Neville Route and [facility] for a follow-up visit.  You may also make a appointment with the ophthalmologist that you saw at [facility].  Begin taking medication that was sent to your pharmacy.  Zovirax is for the next 5 days and also Percocet as needed for pain.  Do not drive or operate machinery while taking the pain medication as it could cause drowsiness and increase your risk for injury.
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
## 2019-05-27 NOTE — ED Notes (Signed)
R eye distance 20/30 L eye (affected) distance 20/50

## 2019-05-27 NOTE — ED Triage Notes (Signed)
Pt c/o having a flare up with shingles in the left eye since Monday, states she has a hx of shingles in that eye since she was a child.

## 2020-03-09 ENCOUNTER — Other Ambulatory Visit: Payer: Self-pay

## 2020-03-09 ENCOUNTER — Encounter: Payer: Self-pay | Admitting: Emergency Medicine

## 2020-03-09 ENCOUNTER — Emergency Department
Admission: EM | Admit: 2020-03-09 | Discharge: 2020-03-09 | Disposition: A | Discharge status: Home / Self Care | Payer: 59 | Attending: Emergency Medicine | Admitting: Emergency Medicine

## 2020-03-09 DIAGNOSIS — Z20822 Contact with and (suspected) exposure to covid-19: Secondary | ICD-10-CM | POA: Insufficient documentation

## 2020-03-09 DIAGNOSIS — R6883 Chills (without fever): Secondary | ICD-10-CM | POA: Diagnosis present

## 2020-03-09 DIAGNOSIS — N390 Urinary tract infection, site not specified: Secondary | ICD-10-CM

## 2020-03-09 DIAGNOSIS — Z79899 Other long term (current) drug therapy: Secondary | ICD-10-CM | POA: Diagnosis not present

## 2020-03-09 DIAGNOSIS — R52 Pain, unspecified: Secondary | ICD-10-CM

## 2020-03-09 DIAGNOSIS — F17200 Nicotine dependence, unspecified, uncomplicated: Secondary | ICD-10-CM | POA: Insufficient documentation

## 2020-03-09 LAB — URINALYSIS, COMPLETE (UACMP) WITH MICROSCOPIC
Bilirubin Urine: NEGATIVE
Glucose, UA: NEGATIVE mg/dL
Ketones, ur: NEGATIVE mg/dL
Nitrite: POSITIVE — AB
Protein, ur: NEGATIVE mg/dL
Specific Gravity, Urine: 1.028 (ref 1.005–1.030)
pH: 6 (ref 5.0–8.0)

## 2020-03-09 LAB — SARS CORONAVIRUS 2 (TAT 6-24 HRS): SARS Coronavirus 2: NEGATIVE

## 2020-03-09 MED ORDER — CEPHALEXIN 500 MG PO CAPS
500.0000 mg | ORAL_CAPSULE | Freq: Once | ORAL | Status: AC
  Administered 2020-03-09: 500 mg via ORAL
  Filled 2020-03-09: qty 1

## 2020-03-09 MED ORDER — CEPHALEXIN 500 MG PO CAPS: 500.0000 mg | ORAL_CAPSULE | Freq: Three times a day (TID) | ORAL | 0 refills | Status: DC

## 2020-03-09 NOTE — ED Notes (Signed)
Pt resting quietly. Pt easily woken and given d/c instructions.

## 2020-03-09 NOTE — ED Triage Notes (Signed)
Patient ambulatory to triage with steady gait, without difficulty or distress noted, mask in place; pt reports since yesterday having chills & body aches

## 2020-03-09 NOTE — Discharge Instructions (Addendum)
1.  Take antibiotic as prescribed (Keflex 500 mg 3 times daily x7 days). 2.  Urine culture is pending.  We will notify you of any positive results requiring a change in your antibiotic. 3.  COVID-19 test is pending.  We will notify you of any positive results. 4.  Return to the ER for worsening symptoms, persistent vomiting, difficulty breathing or other concerns.
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
## 2020-03-11 LAB — URINE CULTURE: Culture: >=100000 — AB

## 2020-03-29 ENCOUNTER — Emergency Department: Payer: 59

## 2020-03-29 ENCOUNTER — Other Ambulatory Visit: Payer: Self-pay

## 2020-03-29 ENCOUNTER — Emergency Department
Admission: EM | Admit: 2020-03-29 | Discharge: 2020-03-29 | Disposition: A | Discharge status: Home / Self Care | Payer: 59 | Attending: Student | Admitting: Student

## 2020-03-29 DIAGNOSIS — M542 Cervicalgia: Secondary | ICD-10-CM | POA: Insufficient documentation

## 2020-03-29 DIAGNOSIS — F17210 Nicotine dependence, cigarettes, uncomplicated: Secondary | ICD-10-CM | POA: Insufficient documentation

## 2020-03-29 DIAGNOSIS — M549 Dorsalgia, unspecified: Secondary | ICD-10-CM | POA: Insufficient documentation

## 2020-03-29 DIAGNOSIS — Y92410 Unspecified street and highway as the place of occurrence of the external cause: Secondary | ICD-10-CM | POA: Diagnosis not present

## 2020-03-29 MED ORDER — CYCLOBENZAPRINE HCL 5 MG PO TABS: ORAL_TABLET | ORAL | 0 refills | Status: DC

## 2020-03-29 MED ORDER — IBUPROFEN 600 MG PO TABS: 600.0000 mg | ORAL_TABLET | Freq: Four times a day (QID) | ORAL | 0 refills | Status: DC | PRN
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
## 2020-05-26 ENCOUNTER — Emergency Department
Admission: EM | Admit: 2020-05-26 | Discharge: 2020-05-26 | Disposition: A | Discharge status: Home / Self Care | Payer: 59 | Attending: Emergency Medicine | Admitting: Emergency Medicine

## 2020-05-26 ENCOUNTER — Emergency Department: Payer: 59

## 2020-05-26 ENCOUNTER — Other Ambulatory Visit: Payer: Self-pay

## 2020-05-26 DIAGNOSIS — M25569 Pain in unspecified knee: Secondary | ICD-10-CM | POA: Diagnosis present

## 2020-05-26 DIAGNOSIS — Z79899 Other long term (current) drug therapy: Secondary | ICD-10-CM | POA: Insufficient documentation

## 2020-05-26 DIAGNOSIS — F17210 Nicotine dependence, cigarettes, uncomplicated: Secondary | ICD-10-CM | POA: Diagnosis not present

## 2020-05-26 DIAGNOSIS — M25562 Pain in left knee: Secondary | ICD-10-CM | POA: Insufficient documentation

## 2020-05-26 DIAGNOSIS — W19XXXA Unspecified fall, initial encounter: Secondary | ICD-10-CM | POA: Insufficient documentation

## 2020-05-26 MED ORDER — TRAMADOL HCL 50 MG PO TABS: 50.0000 mg | ORAL_TABLET | Freq: Three times a day (TID) | ORAL | 0 refills | Status: AC | PRN

## 2020-05-26 MED ORDER — IBUPROFEN 600 MG PO TABS: 600.0000 mg | ORAL_TABLET | Freq: Three times a day (TID) | ORAL | 0 refills | Status: DC | PRN

## 2020-05-26 MED ORDER — IBUPROFEN 800 MG PO TABS
800.0000 mg | ORAL_TABLET | Freq: Once | ORAL | Status: AC
  Administered 2020-05-26: 800 mg via ORAL
  Filled 2020-05-26: qty 1

## 2020-05-26 NOTE — Discharge Instructions (Signed)
You were seen today for left knee pain and swelling.  Your x-ray is negative for acute findings.  We gave you ibuprofen, a knee brace and crutches for comfort.  You may apply ice for 10 minutes twice daily.  I have sent you home with prescriptions for ibuprofen and tramadol.  Please take these medications as prescribed.  Follow-up with your PCP if symptoms persist or worsen.

## 2020-05-26 NOTE — ED Triage Notes (Signed)
Pt stepped in a hole in the yard and fell tonight.  Pt has pain in left knee and lower leg.  Pt has swelling to leg.  Pt alert   Speech clear. Tara Bray
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
## 2020-06-24 ENCOUNTER — Other Ambulatory Visit: Payer: Self-pay

## 2020-06-28 ENCOUNTER — Other Ambulatory Visit: Payer: Self-pay

## 2020-06-28 ENCOUNTER — Ambulatory Visit: Payer: 59 | Admitting: Nurse Practitioner

## 2020-06-28 ENCOUNTER — Encounter: Payer: Self-pay | Admitting: Nurse Practitioner

## 2020-06-28 VITALS — BP 118/74 | HR 74 | Temp 97.4°F | Ht 60.0 in | Wt 142.8 lb

## 2020-06-28 DIAGNOSIS — R233 Spontaneous ecchymoses: Secondary | ICD-10-CM

## 2020-06-28 DIAGNOSIS — G44229 Chronic tension-type headache, not intractable: Secondary | ICD-10-CM | POA: Diagnosis not present

## 2020-06-28 DIAGNOSIS — F329 Major depressive disorder, single episode, unspecified: Secondary | ICD-10-CM

## 2020-06-28 DIAGNOSIS — D696 Thrombocytopenia, unspecified: Secondary | ICD-10-CM

## 2020-06-28 DIAGNOSIS — D72829 Elevated white blood cell count, unspecified: Secondary | ICD-10-CM

## 2020-06-28 DIAGNOSIS — I890 Lymphedema, not elsewhere classified: Secondary | ICD-10-CM | POA: Insufficient documentation

## 2020-06-28 DIAGNOSIS — F419 Anxiety disorder, unspecified: Secondary | ICD-10-CM

## 2020-06-28 DIAGNOSIS — R519 Headache, unspecified: Secondary | ICD-10-CM

## 2020-06-28 DIAGNOSIS — Z1231 Encounter for screening mammogram for malignant neoplasm of breast: Secondary | ICD-10-CM

## 2020-06-28 DIAGNOSIS — G8929 Other chronic pain: Secondary | ICD-10-CM

## 2020-06-28 DIAGNOSIS — R600 Localized edema: Secondary | ICD-10-CM

## 2020-06-28 DIAGNOSIS — Z1159 Encounter for screening for other viral diseases: Secondary | ICD-10-CM

## 2020-06-28 DIAGNOSIS — Z6827 Body mass index (BMI) 27.0-27.9, adult: Secondary | ICD-10-CM

## 2020-06-28 DIAGNOSIS — R238 Other skin changes: Secondary | ICD-10-CM

## 2020-06-28 DIAGNOSIS — Z0000 Encounter for general adult medical examination without abnormal findings: Secondary | ICD-10-CM

## 2020-06-28 HISTORY — DX: Headache, unspecified: R51.9

## 2020-06-28 HISTORY — DX: Other chronic pain: G89.29

## 2020-06-28 HISTORY — DX: Localized edema: R60.0

## 2020-06-28 LAB — CBC WITH DIFFERENTIAL/PLATELET
Basophils Absolute: 0 10*3/uL (ref 0.0–0.1)
Basophils Relative: 0.4 % (ref 0.0–3.0)
Eosinophils Absolute: 0 10*3/uL (ref 0.0–0.7)
Eosinophils Relative: 0.2 % (ref 0.0–5.0)
HCT: 42.7 % (ref 36.0–46.0)
Hemoglobin: 14 g/dL (ref 12.0–15.0)
Lymphocytes Relative: 27.2 % (ref 12.0–46.0)
Lymphs Abs: 2.9 10*3/uL (ref 0.7–4.0)
MCHC: 32.9 g/dL (ref 30.0–36.0)
MCV: 94.2 fl (ref 78.0–100.0)
Monocytes Absolute: 0.6 10*3/uL (ref 0.1–1.0)
Monocytes Relative: 5.7 % (ref 3.0–12.0)
Neutro Abs: 7.1 10*3/uL (ref 1.4–7.7)
Neutrophils Relative %: 66.5 % (ref 43.0–77.0)
Platelets: 138 10*3/uL — ABNORMAL LOW (ref 150.0–400.0)
RBC: 4.53 Mil/uL (ref 3.87–5.11)
RDW: 13.9 % (ref 11.5–15.5)
WBC: 10.6 10*3/uL — ABNORMAL HIGH (ref 4.0–10.5)

## 2020-06-28 LAB — COMPREHENSIVE METABOLIC PANEL
ALT: 12 U/L (ref 0–35)
AST: 16 U/L (ref 0–37)
Albumin: 3.9 g/dL (ref 3.5–5.2)
Alkaline Phosphatase: 44 U/L (ref 39–117)
BUN: 9 mg/dL (ref 6–23)
CO2: 25 mEq/L (ref 19–32)
Calcium: 9.3 mg/dL (ref 8.4–10.5)
Chloride: 110 mEq/L (ref 96–112)
Creatinine, Ser: 0.7 mg/dL (ref 0.40–1.20)
GFR: 107.26 mL/min (ref >60.00)
Glucose, Bld: 102 mg/dL — ABNORMAL HIGH (ref 70–99)
Potassium: 3.9 mEq/L (ref 3.5–5.1)
Sodium: 140 mEq/L (ref 135–145)
Total Bilirubin: 0.4 mg/dL (ref 0.2–1.2)
Total Protein: 5.7 g/dL — ABNORMAL LOW (ref 6.0–8.3)

## 2020-06-28 LAB — LIPID PANEL
Cholesterol: 175 mg/dL (ref 0–200)
HDL: 38.6 mg/dL — ABNORMAL LOW (ref >39.00)
LDL Cholesterol: 112 mg/dL — ABNORMAL HIGH (ref 0–99)
NonHDL: 136.85
Total CHOL/HDL Ratio: 5
Triglycerides: 125 mg/dL (ref 0.0–149.0)
VLDL: 25 mg/dL (ref 0.0–40.0)

## 2020-06-28 LAB — TSH: TSH: 0.4 u[IU]/mL (ref 0.35–4.50)

## 2020-06-28 LAB — B12 AND FOLATE PANEL
Folate: 6.6 ng/mL (ref >5.9)
Vitamin B-12: 326 pg/mL (ref 211–911)

## 2020-06-28 LAB — HEMOGLOBIN A1C: Hgb A1c MFr Bld: 6 % (ref 4.6–6.5)

## 2020-06-28 LAB — VITAMIN D 25 HYDROXY (VIT D DEFICIENCY, FRACTURES): VITD: 13.72 ng/mL — ABNORMAL LOW (ref 30.00–100.00)

## 2020-06-28 NOTE — Patient Instructions (Addendum)
Please go to the lab today.  I have placed a referral into psychiatry for anxiety and depression.  I have ordered an ultrasound of both of your lower extremities because of swelling.   I have placed an order for mammogram Please call and schedule your 3D mammogram as discussed.   [facility]  [address] [place], [place] [phone]  Please get a Pfizer COVID-19 Vaccine Information can be found at your pharmacy.   Please return in one month for physical exam.    Living With Depression Everyone experiences occasional disappointment, sadness, and loss in their lives. When you are feeling down, blue, or sad for at least 2 weeks in a row, it may mean that you have depression. Depression can affect your thoughts and feelings, relationships, daily activities, and physical health. It is caused by changes in the way your brain functions. If you receive a diagnosis of depression, your health care provider will tell you which type of depression you have and what treatment options are available to you. If you are living with depression, there are ways to help you recover from it and also ways to prevent it from coming back. How to cope with lifestyle changes Coping with stress     Stress is your body's reaction to life changes and events, both good and bad. Stressful situations may include:  Getting married.  The death of a spouse.  Losing a job.  Retiring.  Having a baby. Stress can last just a few hours or it can be ongoing. Stress can play a major role in depression, so it is important to learn both how to cope with stress and how to think about it differently. Talk with your health care provider or a counselor if you would like to learn more about stress reduction. He or she may suggest some stress reduction techniques, such as:  Music therapy. This can include creating music or listening to music. Choose music that you enjoy and that inspires  you.  Mindfulness-based meditation. This kind of meditation can be done while sitting or walking. It involves being aware of your normal breaths, rather than trying to control your breathing.  Centering prayer. This is a kind of meditation that involves focusing on a spiritual word or phrase. Choose a word, phrase, or sacred image that is meaningful to you and that brings you peace.  Deep breathing. To do this, expand your stomach and inhale slowly through your nose. Hold your breath for 3-5 seconds, then exhale slowly, allowing your stomach muscles to relax.  Muscle relaxation. This involves intentionally tensing muscles then relaxing them. Choose a stress reduction technique that fits your lifestyle and personality. Stress reduction techniques take time and practice to develop. Set aside 5-15 minutes a day to do them. Therapists can offer training in these techniques. The training may be covered by some insurance plans. Other things you can do to manage stress include:  Keeping a stress diary. This can help you learn what triggers your stress and ways to control your response.  Understanding what your limits are and saying no to requests or events that lead to a schedule that is too full.  Thinking about how you respond to certain situations. You may not be able to control everything, but you can control how you react.  Adding humor to your life by watching funny films or TV shows.  Making time for activities that help you relax and not feeling guilty about spending your time this  way.  Medicines Your health care provider may suggest certain medicines if he or she feels that they will help improve your condition. Avoid using alcohol and other substances that may prevent your medicines from working properly (may interact). It is also important to:  Talk with your pharmacist or health care provider about all the medicines that you take, their possible side effects, and what medicines are safe  to take together.  Make it your goal to take part in all treatment decisions (shared decision-making). This includes giving input on the side effects of medicines. It is best if shared decision-making with your health care provider is part of your total treatment plan. If your health care provider prescribes a medicine, you may not notice the full benefits of it for 4-8 weeks. Most people who are treated for depression need to be on medicine for at least 6-12 months after they feel better. If you are taking medicines as part of your treatment, do not stop taking medicines without first talking to your health care provider. You may need to have the medicine slowly decreased (tapered) over time to decrease the risk of harmful side effects. Relationships Your health care provider may suggest family therapy along with individual therapy and drug therapy. While there may not be family problems that are causing you to feel depressed, it is still important to make sure your family learns as much as they can about your mental health. Having your family's support can help make your treatment successful. How to recognize changes in your condition Everyone has a different response to treatment for depression. Recovery from major depression happens when you have not had signs of major depression for two months. This may mean that you will start to:  Have more interest in doing activities.  Feel less hopeless than you did 2 months ago.  Have more energy.  Overeat less often, or have better or improving appetite.  Have better concentration. Your health care provider will work with you to decide the next steps in your recovery. It is also important to recognize when your condition is getting worse. Watch for these signs:  Having fatigue or low energy.  Eating too much or too little.  Sleeping too much or too little.  Feeling restless, agitated, or hopeless.  Having trouble concentrating or making  decisions.  Having unexplained physical complaints.  Feeling irritable, angry, or aggressive. Get help as soon as you or your family members notice these symptoms coming back. How to get support and help from others How to talk with friends and family members about your condition  Talking to friends and family members about your condition can provide you with one way to get support and guidance. Reach out to trusted friends or family members, explain your symptoms to them, and let them know that you are working with a health care provider to treat your depression. Financial resources Not all insurance plans cover mental health care, so it is important to check with your insurance carrier. If paying for co-pays or counseling services is a problem, search for a local or county mental health care center. They may be able to offer public mental health care services at low or no cost when you are not able to see a private health care provider. If you are taking medicine for depression, you may be able to get the generic form, which may be less expensive. Some makers of prescription medicines also offer help to patients who cannot afford the medicines  they need. Follow these instructions at home:   Get the right amount and quality of sleep.  Cut down on using caffeine, tobacco, alcohol, and other potentially harmful substances.  Try to exercise, such as walking or lifting small weights.  Take over-the-counter and prescription medicines only as told by your health care provider.  Eat a healthy diet that includes plenty of vegetables, fruits, whole grains, low-fat dairy products, and lean protein. Do not eat a lot of foods that are high in solid fats, added sugars, or salt.  Keep all follow-up visits as told by your health care provider. This is important. Contact a health care provider if:  You stop taking your antidepressant medicines, and you have any of these  symptoms: ? Nausea. ? Headache. ? Feeling lightheaded. ? Chills and body aches. ? Not being able to sleep (insomnia).  You or your friends and family think your depression is getting worse. Get help right away if:  You have thoughts of hurting yourself or others. If you ever feel like you may hurt yourself or others, or have thoughts about taking your own life, get help right away. You can go to your nearest emergency department or call:  Your local emergency services (911 in the [place]  A suicide crisis helpline, such as the National Suicide Prevention Lifeline at 8387179873. This is open 24-hours a day. Summary  If you are living with depression, there are ways to help you recover from it and also ways to prevent it from coming back.  Work with your health care team to create a management plan that includes counseling, stress management techniques, and healthy lifestyle habits. This information is not intended to replace advice given to you by your health care provider. Make sure you discuss any questions you have with your health care provider. Document Revised: 03/13/2019 Document Reviewed: 10/22/2016 Elsevier Patient Education  2020 [employer]  Managing Anxiety, Adult After being diagnosed with an anxiety disorder, you may be relieved to know why you have felt or behaved a certain way. You may also feel overwhelmed about the treatment ahead and what it will mean for your life. With care and support, you can manage this condition and recover from it. How to manage lifestyle changes Managing stress and anxiety  Stress is your body's reaction to life changes and events, both good and bad. Most stress will last just a few hours, but stress can be ongoing and can lead to more than just stress. Although stress can play a major role in anxiety, it is not the same as anxiety. Stress is usually caused by something external, such as a deadline, test, or competition. Stress normally  passes after the triggering event has ended.  Anxiety is caused by something internal, such as imagining a terrible outcome or worrying that something will go wrong that will devastate you. Anxiety often does not go away even after the triggering event is over, and it can become long-term (chronic) worry. It is important to understand the differences between stress and anxiety and to manage your stress effectively so that it does not lead to an anxious response. Talk with your health care provider or a counselor to learn more about reducing anxiety and stress. He or she may suggest tension reduction techniques, such as:  Music therapy. This can include creating or listening to music that you enjoy and that inspires you.  Mindfulness-based meditation. This involves being aware of your normal breaths while not trying to control your breathing.  It can be done while sitting or walking.  Centering prayer. This involves focusing on a word, phrase, or sacred image that means something to you and brings you peace.  Deep breathing. To do this, expand your stomach and inhale slowly through your nose. Hold your breath for 3-5 seconds. Then exhale slowly, letting your stomach muscles relax.  Self-talk. This involves identifying thought patterns that lead to anxiety reactions and changing those patterns.  Muscle relaxation. This involves tensing muscles and then relaxing them. Choose a tension reduction technique that suits your lifestyle and personality. These techniques take time and practice. Set aside 5-15 minutes a day to do them. Therapists can offer counseling and training in these techniques. The training to help with anxiety may be covered by some insurance plans. Other things you can do to manage stress and anxiety include:  Keeping a stress/anxiety diary. This can help you learn what triggers your reaction and then learn ways to manage your response.  Thinking about how you react to certain  situations. You may not be able to control everything, but you can control your response.  Making time for activities that help you relax and not feeling guilty about spending your time in this way.  Visual imagery and yoga can help you stay calm and relax.  Medicines Medicines can help ease symptoms. Medicines for anxiety include:  Anti-anxiety drugs.  Antidepressants. Medicines are often used as a primary treatment for anxiety disorder. Medicines will be prescribed by a health care provider. When used together, medicines, psychotherapy, and tension reduction techniques may be the most effective treatment. Relationships Relationships can play a big part in helping you recover. Try to spend more time connecting with trusted friends and family members. Consider going to couples counseling, taking family education classes, or going to family therapy. Therapy can help you and others better understand your condition. How to recognize changes in your anxiety Everyone responds differently to treatment for anxiety. Recovery from anxiety happens when symptoms decrease and stop interfering with your daily activities at home or work. This may mean that you will start to:  Have better concentration and focus. Worry will interfere less in your daily thinking.  Sleep better.  Be less irritable.  Have more energy.  Have improved memory. It is important to recognize when your condition is getting worse. Contact your health care provider if your symptoms interfere with home or work and you feel like your condition is not improving. Follow these instructions at home: Activity  Exercise. Most adults should do the following: ? Exercise for at least 150 minutes each week. The exercise should increase your heart rate and make you sweat (moderate-intensity exercise). ? Strengthening exercises at least twice a week.  Get the right amount and quality of sleep. Most adults need 7-9 hours of sleep each  night. Lifestyle   Eat a healthy diet that includes plenty of vegetables, fruits, whole grains, low-fat dairy products, and lean protein. Do not eat a lot of foods that are high in solid fats, added sugars, or salt.  Make choices that simplify your life.  Do not use any products that contain nicotine or tobacco, such as cigarettes, e-cigarettes, and chewing tobacco. If you need help quitting, ask your health care provider.  Avoid caffeine, alcohol, and certain over-the-counter cold medicines. These may make you feel worse. Ask your pharmacist which medicines to avoid. General instructions  Take over-the-counter and prescription medicines only as told by your health care provider.  Keep all  follow-up visits as told by your health care provider. This is important. Where to find support You can get help and support from these sources:  Self-help groups.  Online and [employer].  A trusted spiritual leader.  Couples counseling.  Family education classes.  Family therapy. Where to find more information You may find that joining a support group helps you deal with your anxiety. The following sources can help you locate counselors or support groups near you:  Mental Health [place]: www.mentalhealthamerica.net  [employer]xiety [employer] of [place] (ADAA): ProgramCam.de  [employer]ness (NAMI): www.nami.org Contact a health care provider if you:  Have a hard time staying focused or finishing daily tasks.  Spend many hours a day feeling worried about everyday life.  Become exhausted by worry.  Start to have headaches, feel tense, or have nausea.  Urinate more than normal.  Have diarrhea. Get help right away if you have:  A racing heart and shortness of breath.  Thoughts of hurting yourself or others. If you ever feel like you may hurt yourself or others, or have thoughts about taking your own life, get help right away. You can go  to your nearest emergency department or call:  Your local emergency services (911 in the [place]  A suicide crisis helpline, such as the National Suicide Prevention Lifeline at 812-817-7617. This is open 24 hours a day. Summary  Taking steps to learn and use tension reduction techniques can help calm you and help prevent triggering an anxiety reaction.  When used together, medicines, psychotherapy, and tension reduction techniques may be the most effective treatment.  Family, friends, and partners can play a big part in helping you recover from an anxiety disorder. This information is not intended to replace advice given to you by your health care provider. Make sure you discuss any questions you have with your health care provider. Document Revised: 04/21/2019 Document Reviewed: 04/21/2019 Elsevier Patient Education  2020 [employer].
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
## 2020-06-29 ENCOUNTER — Telehealth: Payer: Self-pay

## 2020-06-29 LAB — HEPATITIS C ANTIBODY
Hepatitis C Ab: NONREACTIVE
SIGNAL TO CUT-OFF: 0.01 (ref <1.00)

## 2020-06-29 NOTE — Telephone Encounter (Signed)
pt called left message to check on her referral

## 2020-06-29 NOTE — Telephone Encounter (Signed)
left message to return call to set up appt

## 2020-07-03 ENCOUNTER — Encounter: Payer: Self-pay | Admitting: Nurse Practitioner

## 2020-07-03 MED ORDER — VITAMIN D (ERGOCALCIFEROL) 1.25 MG (50000 UNIT) PO CAPS: 50000.0000 [IU] | ORAL_CAPSULE | ORAL | 0 refills | Status: DC

## 2020-07-06 NOTE — Addendum Note (Signed)
Addended by: Amedeo Kinsman A on: 07/06/2020 10:25 PM   Modules accepted: Orders
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
## 2020-07-21 ENCOUNTER — Inpatient Hospital Stay: Payer: 59

## 2020-07-21 ENCOUNTER — Inpatient Hospital Stay: Payer: 59 | Attending: Hematology and Oncology | Admitting: Hematology and Oncology

## 2020-07-21 ENCOUNTER — Other Ambulatory Visit: Payer: Self-pay

## 2020-07-21 ENCOUNTER — Encounter: Payer: Self-pay | Admitting: Hematology and Oncology

## 2020-07-21 VITALS — BP 106/70 | HR 57 | Temp 97.3°F | Resp 18 | Ht 61.52 in | Wt 138.1 lb

## 2020-07-21 DIAGNOSIS — F17210 Nicotine dependence, cigarettes, uncomplicated: Secondary | ICD-10-CM | POA: Insufficient documentation

## 2020-07-21 DIAGNOSIS — D72829 Elevated white blood cell count, unspecified: Secondary | ICD-10-CM | POA: Insufficient documentation

## 2020-07-21 DIAGNOSIS — R233 Spontaneous ecchymoses: Secondary | ICD-10-CM | POA: Diagnosis not present

## 2020-07-21 DIAGNOSIS — Z87440 Personal history of urinary (tract) infections: Secondary | ICD-10-CM | POA: Insufficient documentation

## 2020-07-21 DIAGNOSIS — Z791 Long term (current) use of non-steroidal anti-inflammatories (NSAID): Secondary | ICD-10-CM | POA: Insufficient documentation

## 2020-07-21 DIAGNOSIS — G629 Polyneuropathy, unspecified: Secondary | ICD-10-CM | POA: Insufficient documentation

## 2020-07-21 DIAGNOSIS — R238 Other skin changes: Secondary | ICD-10-CM

## 2020-07-21 DIAGNOSIS — Z7982 Long term (current) use of aspirin: Secondary | ICD-10-CM | POA: Insufficient documentation

## 2020-07-21 DIAGNOSIS — D696 Thrombocytopenia, unspecified: Secondary | ICD-10-CM

## 2020-07-21 DIAGNOSIS — Z809 Family history of malignant neoplasm, unspecified: Secondary | ICD-10-CM | POA: Diagnosis not present

## 2020-07-21 LAB — COMPREHENSIVE METABOLIC PANEL
ALT: 18 U/L (ref 0–44)
AST: 19 U/L (ref 15–41)
Albumin: 4.2 g/dL (ref 3.5–5.0)
Alkaline Phosphatase: 41 U/L (ref 38–126)
Anion gap: 7 (ref 5–15)
BUN: 12 mg/dL (ref 6–20)
CO2: 27 mmol/L (ref 22–32)
Calcium: 9.6 mg/dL (ref 8.9–10.3)
Chloride: 105 mmol/L (ref 98–111)
Creatinine, Ser: 0.74 mg/dL (ref 0.44–1.00)
GFR calc Af Amer: >60 mL/min (ref >60)
GFR calc non Af Amer: >60 mL/min (ref >60)
Glucose, Bld: 91 mg/dL (ref 70–99)
Potassium: 4.3 mmol/L (ref 3.5–5.1)
Sodium: 139 mmol/L (ref 135–145)
Total Bilirubin: 1 mg/dL (ref 0.3–1.2)
Total Protein: 6.7 g/dL (ref 6.5–8.1)

## 2020-07-21 LAB — CBC WITH DIFFERENTIAL/PLATELET
Abs Immature Granulocytes: 0.02 10*3/uL (ref 0.00–0.07)
Basophils Absolute: 0 10*3/uL (ref 0.0–0.1)
Basophils Relative: 0 %
Eosinophils Absolute: 0 10*3/uL (ref 0.0–0.5)
Eosinophils Relative: 0 %
HCT: 43.3 % (ref 36.0–46.0)
Hemoglobin: 14.5 g/dL (ref 12.0–15.0)
Immature Granulocytes: 0 %
Lymphocytes Relative: 25 %
Lymphs Abs: 2.4 10*3/uL (ref 0.7–4.0)
MCH: 30.7 pg (ref 26.0–34.0)
MCHC: 33.5 g/dL (ref 30.0–36.0)
MCV: 91.5 fL (ref 80.0–100.0)
Monocytes Absolute: 0.6 10*3/uL (ref 0.1–1.0)
Monocytes Relative: 6 %
Neutro Abs: 6.4 10*3/uL (ref 1.7–7.7)
Neutrophils Relative %: 69 %
Platelets: 163 10*3/uL (ref 150–400)
RBC: 4.73 MIL/uL (ref 3.87–5.11)
RDW: 13.2 % (ref 11.5–15.5)
WBC: 9.5 10*3/uL (ref 4.0–10.5)
nRBC: 0 % (ref 0.0–0.2)

## 2020-07-21 LAB — T4, FREE: Free T4: 0.95 ng/dL (ref 0.61–1.12)

## 2020-07-21 LAB — PROTIME-INR
INR: 1 (ref 0.8–1.2)
Prothrombin Time: 12.5 seconds (ref 11.4–15.2)

## 2020-07-21 LAB — PATHOLOGIST SMEAR REVIEW

## 2020-07-21 LAB — HIV ANTIBODY (ROUTINE TESTING W REFLEX): HIV Screen 4th Generation wRfx: NONREACTIVE

## 2020-07-21 LAB — APTT: aPTT: 30 seconds (ref 24–36)

## 2020-07-21 LAB — PLATELET BY CITRATE: Platelet CT in Citrate: 129

## 2020-07-21 LAB — HEPATITIS B CORE ANTIBODY, TOTAL: Hep B Core Total Ab: NONREACTIVE

## 2020-07-21 NOTE — Progress Notes (Signed)
The patient c/o swellen and pain noted to bilateral legs with busies. ( pain level 7).

## 2020-07-22 LAB — ANA W/REFLEX: Anti Nuclear Antibody (ANA): NEGATIVE

## 2020-07-24 DIAGNOSIS — D72829 Elevated white blood cell count, unspecified: Secondary | ICD-10-CM | POA: Insufficient documentation

## 2020-07-24 DIAGNOSIS — D696 Thrombocytopenia, unspecified: Secondary | ICD-10-CM | POA: Insufficient documentation

## 2020-07-26 ENCOUNTER — Encounter: Payer: 59 | Admitting: Nurse Practitioner

## 2020-07-28 ENCOUNTER — Telehealth: Payer: Self-pay | Admitting: Nurse Practitioner

## 2020-07-28 NOTE — Telephone Encounter (Signed)
Tried to call patient and number rang and rang then said call could not be completed at this time

## 2020-07-28 NOTE — Telephone Encounter (Signed)
Pt had second covid vaccination [employer]) on Sunday and has had no energy since. Pt's employer is wanting her to come back to work but she is not up for it right now. Pt is unsure if it is from the vaccine or other chronic conditions. Please advise. No appts avail until Monday.

## 2020-07-29 NOTE — Telephone Encounter (Signed)
She can be seen in UC if having concerning symptoms.

## 2020-07-29 NOTE — Telephone Encounter (Signed)
Patient states her arm has been very sore and can barely lift it to even brush her hair. She did take some ibuprofen to try and get some sleep; helped some. Patient also has a lot of fatigue. Wants to know what she can do or if she needs to go to UC?

## 2020-07-29 NOTE — Telephone Encounter (Signed)
Patient aware of below and will go to the UC if things get worse

## 2020-08-01 ENCOUNTER — Ambulatory Visit (INDEPENDENT_AMBULATORY_CARE_PROVIDER_SITE_OTHER): Payer: 59 | Admitting: Vascular Surgery

## 2020-08-01 ENCOUNTER — Inpatient Hospital Stay: Payer: 59 | Admitting: Hematology and Oncology

## 2020-08-01 ENCOUNTER — Other Ambulatory Visit: Payer: Self-pay

## 2020-08-01 ENCOUNTER — Encounter (INDEPENDENT_AMBULATORY_CARE_PROVIDER_SITE_OTHER): Payer: Self-pay | Admitting: Vascular Surgery

## 2020-08-01 VITALS — BP 111/77 | HR 60 | Ht 60.0 in | Wt 140.0 lb

## 2020-08-01 DIAGNOSIS — I872 Venous insufficiency (chronic) (peripheral): Secondary | ICD-10-CM

## 2020-08-01 DIAGNOSIS — I890 Lymphedema, not elsewhere classified: Secondary | ICD-10-CM
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
## 2020-08-04 ENCOUNTER — Telehealth: Payer: Self-pay | Admitting: Nurse Practitioner

## 2020-08-04 NOTE — Telephone Encounter (Signed)
Rejection Reason - Patient did not respond" [facility] said about 18 hours ago 

## 2020-08-04 NOTE — Telephone Encounter (Signed)
Please call her and ask her to schedule an appt- or did she find a different provider?

## 2020-08-04 NOTE — Telephone Encounter (Signed)
Tried to call patient but call could not be completed; will try again tomorrow

## 2020-08-09 ENCOUNTER — Encounter: Payer: 59 | Admitting: Nurse Practitioner

## 2020-08-09 DIAGNOSIS — Z0289 Encounter for other administrative examinations: Secondary | ICD-10-CM

## 2020-08-09 NOTE — Telephone Encounter (Signed)
Tried to call but no answer and no machine to leave a message. Patient was supposed to be seen today in office but no showed. Will send letter to patient.

## 2020-08-23 ENCOUNTER — Encounter: Payer: Self-pay | Admitting: Nurse Practitioner

## 2020-10-24 ENCOUNTER — Encounter (INDEPENDENT_AMBULATORY_CARE_PROVIDER_SITE_OTHER): Payer: 59

## 2020-10-24 ENCOUNTER — Ambulatory Visit (INDEPENDENT_AMBULATORY_CARE_PROVIDER_SITE_OTHER): Payer: 59 | Admitting: Vascular Surgery

## 2021-01-03 ENCOUNTER — Telehealth: Payer: Self-pay | Admitting: Internal Medicine

## 2021-01-03 NOTE — Telephone Encounter (Signed)
Received notification that her mammogram is overdue.  Please notify her that Tara Bray is no longer at out office and she needs PCP.  Will need mammogram as well.

## 2021-01-04 NOTE — Telephone Encounter (Signed)
Left detailed message for patient.

## 2021-01-04 NOTE — Telephone Encounter (Signed)
Pt was notified of what Dr. Lorin Picket said. I also gave her the information to [facility].

## 2021-10-03 ENCOUNTER — Emergency Department: Payer: 59

## 2021-10-03 ENCOUNTER — Other Ambulatory Visit: Payer: Self-pay

## 2021-10-03 ENCOUNTER — Emergency Department
Admission: EM | Admit: 2021-10-03 | Discharge: 2021-10-03 | Disposition: A | Discharge status: Home / Self Care | Payer: 59 | Attending: Emergency Medicine | Admitting: Emergency Medicine

## 2021-10-03 ENCOUNTER — Encounter: Payer: Self-pay | Admitting: Emergency Medicine

## 2021-10-03 DIAGNOSIS — F17210 Nicotine dependence, cigarettes, uncomplicated: Secondary | ICD-10-CM | POA: Insufficient documentation

## 2021-10-03 DIAGNOSIS — R519 Headache, unspecified: Secondary | ICD-10-CM | POA: Insufficient documentation

## 2021-10-03 DIAGNOSIS — H538 Other visual disturbances: Secondary | ICD-10-CM | POA: Insufficient documentation

## 2021-10-03 LAB — COMPREHENSIVE METABOLIC PANEL
ALT: 14 U/L (ref 0–44)
AST: 15 U/L (ref 15–41)
Albumin: 3.7 g/dL (ref 3.5–5.0)
Alkaline Phosphatase: 46 U/L (ref 38–126)
Anion gap: 5 (ref 5–15)
BUN: 10 mg/dL (ref 6–20)
CO2: 28 mmol/L (ref 22–32)
Calcium: 9.5 mg/dL (ref 8.9–10.3)
Chloride: 108 mmol/L (ref 98–111)
Creatinine, Ser: 0.75 mg/dL (ref 0.44–1.00)
GFR, Estimated: >60 mL/min (ref >60)
Glucose, Bld: 102 mg/dL — ABNORMAL HIGH (ref 70–99)
Potassium: 3.6 mmol/L (ref 3.5–5.1)
Sodium: 141 mmol/L (ref 135–145)
Total Bilirubin: 0.7 mg/dL (ref 0.3–1.2)
Total Protein: 6.3 g/dL — ABNORMAL LOW (ref 6.5–8.1)

## 2021-10-03 LAB — CBC WITH DIFFERENTIAL/PLATELET
Abs Immature Granulocytes: 0.02 10*3/uL (ref 0.00–0.07)
Basophils Absolute: 0.1 10*3/uL (ref 0.0–0.1)
Basophils Relative: 1 %
Eosinophils Absolute: 0 10*3/uL (ref 0.0–0.5)
Eosinophils Relative: 0 %
HCT: 42.2 % (ref 36.0–46.0)
Hemoglobin: 14.8 g/dL (ref 12.0–15.0)
Immature Granulocytes: 0 %
Lymphocytes Relative: 33 %
Lymphs Abs: 3.6 10*3/uL (ref 0.7–4.0)
MCH: 32.1 pg (ref 26.0–34.0)
MCHC: 35.1 g/dL (ref 30.0–36.0)
MCV: 91.5 fL (ref 80.0–100.0)
Monocytes Absolute: 0.6 10*3/uL (ref 0.1–1.0)
Monocytes Relative: 6 %
Neutro Abs: 6.7 10*3/uL (ref 1.7–7.7)
Neutrophils Relative %: 60 %
Platelets: 158 10*3/uL (ref 150–400)
RBC: 4.61 MIL/uL (ref 3.87–5.11)
RDW: 13.5 % (ref 11.5–15.5)
WBC: 11 10*3/uL — ABNORMAL HIGH (ref 4.0–10.5)
nRBC: 0 % (ref 0.0–0.2)

## 2021-10-03 LAB — LACTIC ACID, PLASMA
Lactic Acid, Venous: 1.1 mmol/L (ref 0.5–1.9)
Lactic Acid, Venous: 1.3 mmol/L (ref 0.5–1.9)

## 2021-10-03 MED ORDER — SODIUM CHLORIDE 0.9 % IV BOLUS
1000.0000 mL | Freq: Once | INTRAVENOUS | Status: AC
  Administered 2021-10-03: 1000 mL via INTRAVENOUS

## 2021-10-03 MED ORDER — PROCHLORPERAZINE EDISYLATE 10 MG/2ML IJ SOLN
10.0000 mg | Freq: Once | INTRAMUSCULAR | Status: AC
  Administered 2021-10-03: 10 mg via INTRAVENOUS
  Filled 2021-10-03: qty 2

## 2021-10-03 MED ORDER — DIPHENHYDRAMINE HCL 50 MG/ML IJ SOLN
12.5000 mg | Freq: Once | INTRAMUSCULAR | Status: AC
  Administered 2021-10-03: 12.5 mg via INTRAVENOUS
  Filled 2021-10-03: qty 1

## 2021-10-03 NOTE — ED Triage Notes (Signed)
Pt reports that she developed a headache Sunday, she went to work and the lights were making it worse. She developed blurred vision today, she states that it clears up after blinking.
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
## 2021-10-03 NOTE — ED Provider Notes (Deleted)
Emergency Medicine Provider Triage Evaluation Note  Greta Yung , a 50 y.o. female  was evaluated in triage.  Pt complains of right foot pain that has been evaluated by podiatry. Dr. 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 Infection has been present for the past 1-2 months.  Review of Systems  Positive: Right great toe pain Negative: Fever  Physical Exam  BP (!) 144/97 (BP Location: Right Arm)   Pulse 92   Temp 98.8 F (37.1 C) (Oral)   Resp 20   Ht 5' (1.524 m)   Wt 61.2 kg   SpO2 97%   BMI 26.37 kg/m  Gen:   Awake, no distress   Resp:  Normal effort  MSK:   Moves extremities without difficulty    Medical Decision Making  Medically screening exam initiated at 1:37 PM.  Appropriate orders placed.  Alga Southall was informed that the remainder of the evaluation will be completed by another provider, this initial triage assessment does not replace that evaluation, and the importance of remaining in the ED until their evaluation is complete.   Chinita Pester, FNP 10/03/21 1346

## 2021-10-03 NOTE — Discharge Instructions (Signed)
She can take Tylenol 1 g every 8 hours and ibuprofen 600 every 6-8 hours with food.  Return to the ER for blurry vision, worsening headache, fevers or any other concerns.  Otherwise you can call your eye doctor to get a follow-up to evaluate your eyes.  However when I evaluated them you have 20/20 vision and you are not really blurred but more of a sensitivity to light

## 2022-03-07 IMAGING — CR DG KNEE COMPLETE 4+V*L*
4 series · 4 of 4 positions shown · non-contrast
Comparison: March 19, 2018

CLINICAL DATA: Status post trauma.

EXAM:
LEFT KNEE - COMPLETE 4+ VIEW

[knee ap]
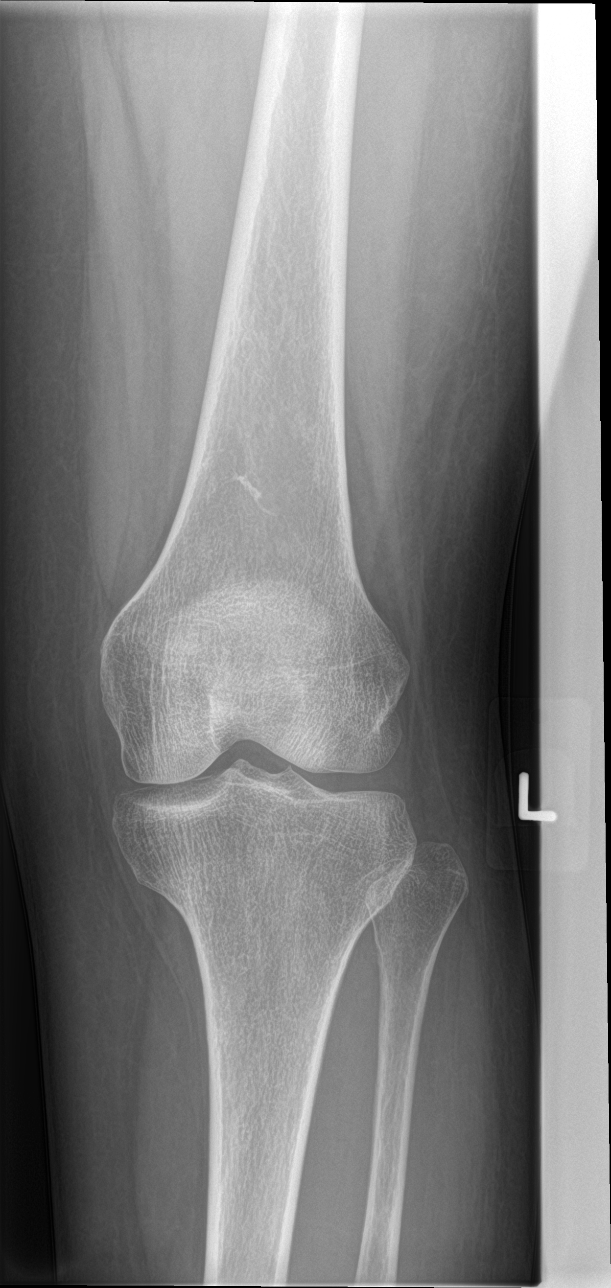

[knee obl (1 of 2)]
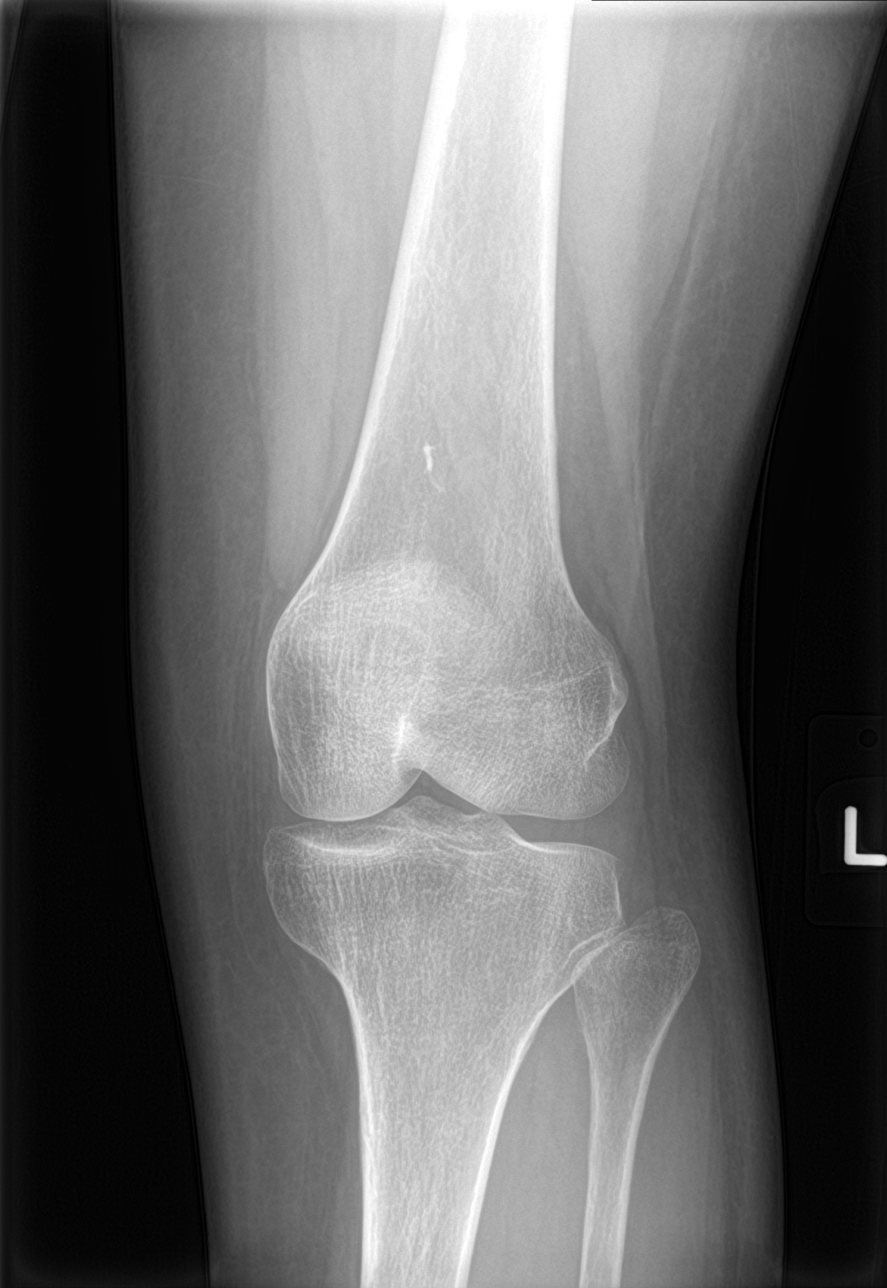

[knee obl (2 of 2)]
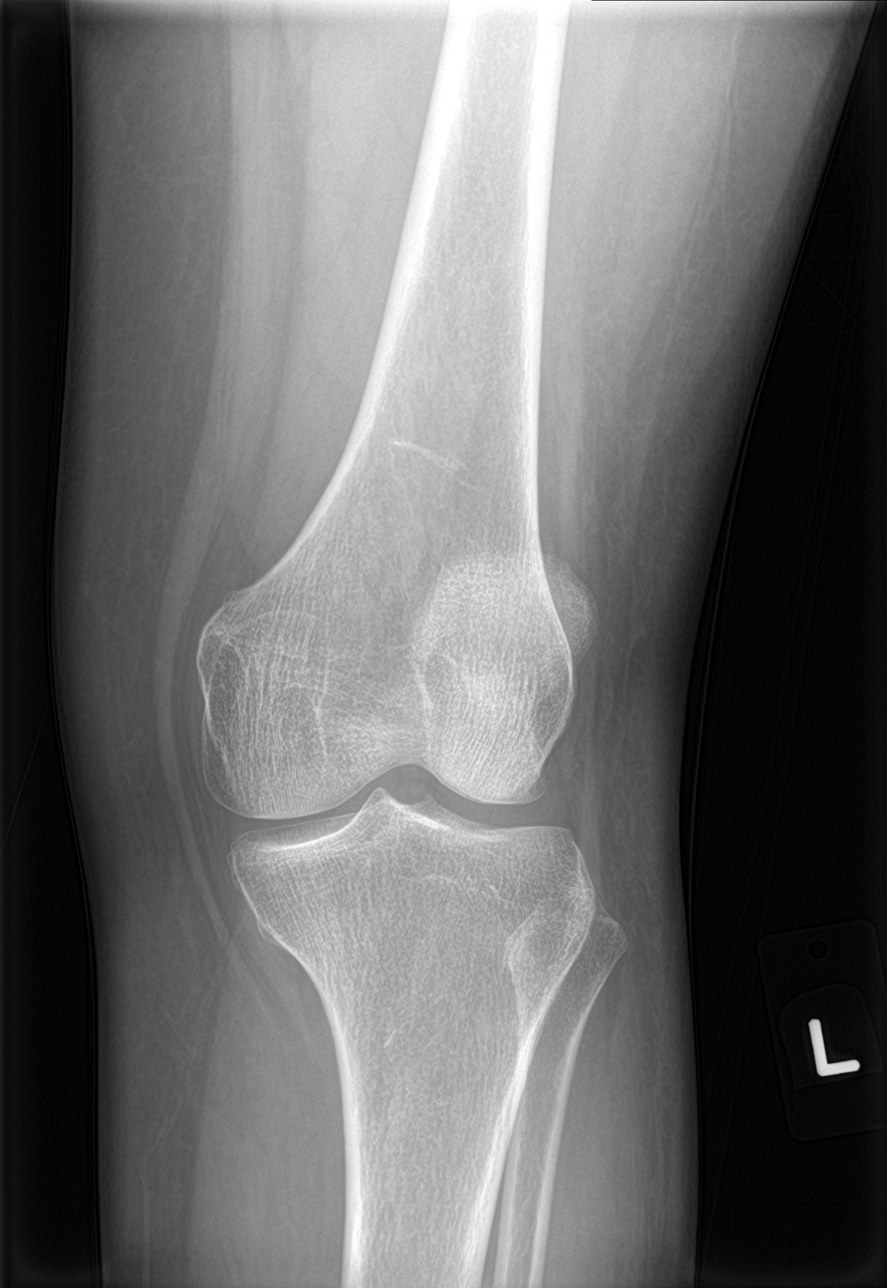

[knee lat]
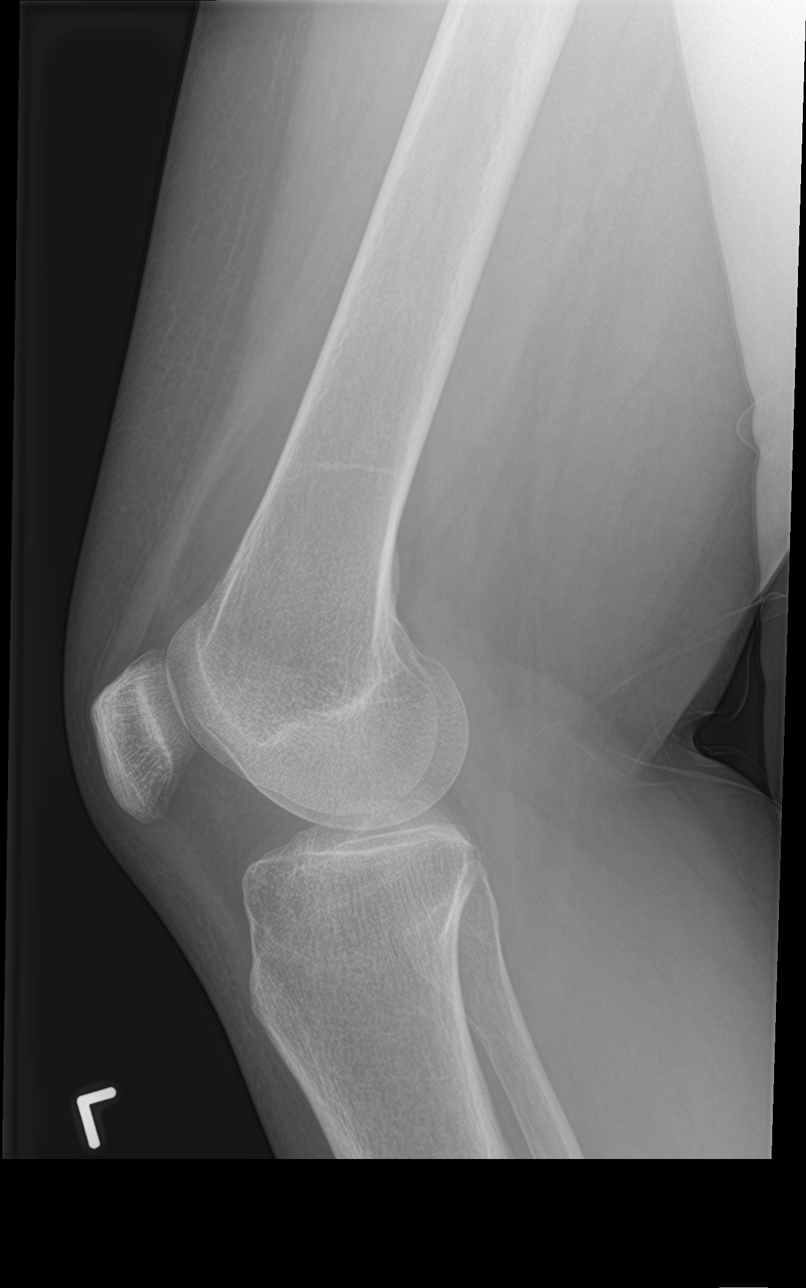

[4 of 4 positions shown; findings below may reference images not displayed]

FINDINGS: No evidence of an acute fracture, dislocation, or joint effusion. No
evidence of arthropathy. A predominant stable, thin, linear area of
medullary sclerosis is seen within the shaft of the distal left
femur. Soft tissues are unremarkable.
IMPRESSION: No acute osseous abnormality.

## 2022-05-10 LAB — HM MAMMOGRAPHY

## 2022-05-15 ENCOUNTER — Encounter: Payer: Self-pay | Admitting: Internal Medicine

## 2022-05-23 ENCOUNTER — Other Ambulatory Visit: Payer: Self-pay

## 2022-05-23 ENCOUNTER — Emergency Department (HOSPITAL_COMMUNITY)
Admission: EM | Admit: 2022-05-23 | Discharge: 2022-05-23 | Disposition: A | Discharge status: Home / Self Care | Payer: BC Managed Care – PPO | Attending: Emergency Medicine | Admitting: Emergency Medicine

## 2022-05-23 ENCOUNTER — Emergency Department (HOSPITAL_COMMUNITY): Payer: BC Managed Care – PPO

## 2022-05-23 DIAGNOSIS — Y92410 Unspecified street and highway as the place of occurrence of the external cause: Secondary | ICD-10-CM | POA: Diagnosis not present

## 2022-05-23 DIAGNOSIS — S20219A Contusion of unspecified front wall of thorax, initial encounter: Secondary | ICD-10-CM | POA: Insufficient documentation

## 2022-05-23 DIAGNOSIS — S8992XA Unspecified injury of left lower leg, initial encounter: Secondary | ICD-10-CM | POA: Diagnosis present

## 2022-05-23 DIAGNOSIS — S82892A Other fracture of left lower leg, initial encounter for closed fracture: Secondary | ICD-10-CM | POA: Diagnosis not present

## 2022-05-23 DIAGNOSIS — S82899A Other fracture of unspecified lower leg, initial encounter for closed fracture: Secondary | ICD-10-CM

## 2022-05-23 MED ORDER — MELOXICAM 7.5 MG PO TABS: 7.5000 mg | ORAL_TABLET | Freq: Two times a day (BID) | ORAL | 0 refills | Status: AC | PRN

## 2022-05-23 MED ORDER — NAPROXEN 250 MG PO TABS
500.0000 mg | ORAL_TABLET | Freq: Once | ORAL | Status: AC
  Administered 2022-05-23: 500 mg via ORAL
  Filled 2022-05-23: qty 2
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
## 2022-05-23 NOTE — ED Notes (Signed)
Patient has posterior short splint and ankle splint applied at this time. Patient educated on crutch use and demonstrated use.
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
## 2022-05-30 ENCOUNTER — Ambulatory Visit (INDEPENDENT_AMBULATORY_CARE_PROVIDER_SITE_OTHER): Payer: BC Managed Care – PPO | Admitting: Orthopaedic Surgery

## 2022-05-30 ENCOUNTER — Telehealth: Payer: Self-pay | Admitting: Orthopaedic Surgery

## 2022-05-30 ENCOUNTER — Encounter: Payer: Self-pay | Admitting: Orthopaedic Surgery

## 2022-05-30 ENCOUNTER — Ambulatory Visit (INDEPENDENT_AMBULATORY_CARE_PROVIDER_SITE_OTHER): Payer: BC Managed Care – PPO

## 2022-05-30 DIAGNOSIS — M25572 Pain in left ankle and joints of left foot: Secondary | ICD-10-CM

## 2022-05-30 DIAGNOSIS — S8252XA Displaced fracture of medial malleolus of left tibia, initial encounter for closed fracture: Secondary | ICD-10-CM

## 2022-05-30 MED ORDER — HYDROCODONE-ACETAMINOPHEN 5-325 MG PO TABS: 1.0000 | ORAL_TABLET | Freq: Four times a day (QID) | ORAL | 0 refills | Status: DC | PRN

## 2022-05-30 MED ORDER — ONDANSETRON 4 MG PO TBDP: 4.0000 mg | ORAL_TABLET | Freq: Three times a day (TID) | ORAL | 0 refills | Status: DC | PRN
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
## 2022-05-30 NOTE — Telephone Encounter (Signed)
Called pt 1X left vm for pt to call and set 2 wk follow up with Dr. Magnus Ivan

## 2022-05-31 ENCOUNTER — Other Ambulatory Visit: Payer: Self-pay

## 2022-05-31 ENCOUNTER — Encounter (HOSPITAL_COMMUNITY): Payer: Self-pay | Admitting: Orthopaedic Surgery

## 2022-05-31 ENCOUNTER — Other Ambulatory Visit: Payer: Self-pay | Admitting: Physician Assistant

## 2022-05-31 DIAGNOSIS — S8252XA Displaced fracture of medial malleolus of left tibia, initial encounter for closed fracture: Secondary | ICD-10-CM

## 2022-05-31 NOTE — Anesthesia Preprocedure Evaluation (Addendum)
Anesthesia Evaluation  Patient identified by MRN, date of birth, ID band Patient awake    Reviewed: Allergy & Precautions, NPO status , Patient's Chart, lab work & pertinent test results  History of Anesthesia Complications Negative for: history of anesthetic complications  Airway Mallampati: II  TM Distance: >3 FB Neck ROM: Full    Dental no notable dental hx. (+) Dental Advisory Given   Pulmonary Current Smoker,    Pulmonary exam normal        Cardiovascular negative cardio ROS Normal cardiovascular exam     Neuro/Psych  Headaches, PSYCHIATRIC DISORDERS Anxiety Depression    GI/Hepatic negative GI ROS, Neg liver ROS,   Endo/Other  negative endocrine ROS  Renal/GU negative Renal ROS     Musculoskeletal negative musculoskeletal ROS (+)   Abdominal   Peds  Hematology negative hematology ROS (+)   Anesthesia Other Findings   Reproductive/Obstetrics                            Anesthesia Physical Anesthesia Plan  ASA: 2  Anesthesia Plan: General   Post-op Pain Management: Regional block*, Celebrex PO (pre-op)* and Tylenol PO (pre-op)*   Induction: Intravenous  PONV Risk Score and Plan: 2 and Ondansetron, Midazolam and Dexamethasone  Airway Management Planned: LMA  Additional Equipment:   Intra-op Plan:   Post-operative Plan: Extubation in OR  Informed Consent: I have reviewed the patients History and Physical, chart, labs and discussed the procedure including the risks, benefits and alternatives for the proposed anesthesia with the patient or authorized representative who has indicated his/her understanding and acceptance.     Dental advisory given  Plan Discussed with: Anesthesiologist and CRNA  Anesthesia Plan Comments:        Anesthesia Quick Evaluation
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
## 2022-05-31 NOTE — Progress Notes (Addendum)
For Short Stay: N/A COVID SWAB appointment date: N/A Date of COVID positive in last 90 days: N/A  Bowel Prep reminder: N/A   For Anesthesia: PCP - Theadore Nan, NP  Cardiologist - N/A  Chest x-ray - 05/23/22 in epic EKG - greater than 1 year Stress Test - N/A ECHO - N/A Cardiac Cath - N/A Pacemaker/ICD device last checked:N/A Pacemaker orders received: N/A Device Rep notified: N/A  Spinal Cord Stimulator: N/A  Sleep Study - N/A CPAP - N/A  Fasting Blood Sugar - N/A Checks Blood Sugar ___N/A__ times a day Date and result of last Hgb A1c- N/A  Blood Thinner Instructions: N/A Aspirin Instructions: N/A Last Dose: N/A  Activity level: Able to exercise without chest pain and/or shortness of breath       Anesthesia review:  N/A  Patient denies shortness of breath, fever, cough and chest pain at PAT appointment   Patient verbalized understanding of instructions that were given to them at the PAT appointment. Patient was also instructed that they will need to review over the PAT instructions again at home before surgery.

## 2022-06-01 ENCOUNTER — Ambulatory Visit (HOSPITAL_COMMUNITY): Payer: BC Managed Care – PPO | Admitting: Anesthesiology

## 2022-06-01 ENCOUNTER — Observation Stay (HOSPITAL_COMMUNITY)
Admission: RE | Admit: 2022-06-01 | Discharge: 2022-06-02 | Disposition: A | Discharge status: Home / Self Care | Payer: BC Managed Care – PPO | Source: Ambulatory Visit | Attending: Orthopaedic Surgery | Admitting: Orthopaedic Surgery

## 2022-06-01 ENCOUNTER — Ambulatory Visit (HOSPITAL_COMMUNITY): Payer: BC Managed Care – PPO

## 2022-06-01 ENCOUNTER — Encounter (HOSPITAL_COMMUNITY)
Admission: RE | Disposition: A | Discharge status: Home / Self Care | Payer: Self-pay | Source: Ambulatory Visit | Attending: Orthopaedic Surgery

## 2022-06-01 ENCOUNTER — Encounter (HOSPITAL_COMMUNITY): Payer: Self-pay | Admitting: Orthopaedic Surgery

## 2022-06-01 DIAGNOSIS — S8265XA Nondisplaced fracture of lateral malleolus of left fibula, initial encounter for closed fracture: Secondary | ICD-10-CM | POA: Insufficient documentation

## 2022-06-01 DIAGNOSIS — S8252XA Displaced fracture of medial malleolus of left tibia, initial encounter for closed fracture: Secondary | ICD-10-CM | POA: Diagnosis present

## 2022-06-01 DIAGNOSIS — F17200 Nicotine dependence, unspecified, uncomplicated: Secondary | ICD-10-CM | POA: Insufficient documentation

## 2022-06-01 HISTORY — DX: Personal history of other medical treatment: Z92.89

## 2022-06-01 HISTORY — DX: Personal history of diseases of the blood and blood-forming organs and certain disorders involving the immune mechanism: Z86.2

## 2022-06-01 HISTORY — PX: ORIF ANKLE FRACTURE: SHX5408

## 2022-06-01 SURGERY — OPEN REDUCTION INTERNAL FIXATION (ORIF) ANKLE FRACTURE
Anesthesia: General | Site: Ankle | Laterality: Left

## 2022-06-01 MED ORDER — ROPIVACAINE HCL 5 MG/ML IJ SOLN
INTRAMUSCULAR | Status: DC | PRN
  Administered 2022-06-01: 25 mL via PERINEURAL
  Administered 2022-06-01: 15 mL via PERINEURAL

## 2022-06-01 MED ORDER — FENTANYL CITRATE (PF) 100 MCG/2ML IJ SOLN
INTRAMUSCULAR | Status: AC
  Filled 2022-06-01: qty 2

## 2022-06-01 MED ORDER — SODIUM CHLORIDE 0.9 % IR SOLN
Status: DC | PRN
  Administered 2022-06-01: 1000 mL

## 2022-06-01 MED ORDER — ASPIRIN 325 MG PO TABS
325.0000 mg | ORAL_TABLET | Freq: Every day | ORAL | Status: DC
  Administered 2022-06-02: 325 mg via ORAL
  Filled 2022-06-01: qty 1

## 2022-06-01 MED ORDER — METOCLOPRAMIDE HCL 5 MG PO TABS: 5.0000 mg | ORAL_TABLET | Freq: Three times a day (TID) | ORAL | Status: DC | PRN

## 2022-06-01 MED ORDER — OXYCODONE HCL 5 MG PO TABS: 5.0000 mg | ORAL_TABLET | Freq: Four times a day (QID) | ORAL | 0 refills | Status: DC | PRN

## 2022-06-01 MED ORDER — METOCLOPRAMIDE HCL 5 MG/ML IJ SOLN: 5.0000 mg | Freq: Three times a day (TID) | INTRAMUSCULAR | Status: DC | PRN

## 2022-06-01 MED ORDER — EPHEDRINE SULFATE-NACL 50-0.9 MG/10ML-% IV SOSY
PREFILLED_SYRINGE | INTRAVENOUS | Status: DC | PRN
  Administered 2022-06-01 (×2): 10 mg via INTRAVENOUS

## 2022-06-01 MED ORDER — CEFAZOLIN SODIUM-DEXTROSE 2-4 GM/100ML-% IV SOLN
2.0000 g | INTRAVENOUS | Status: AC
  Administered 2022-06-01: 2 g via INTRAVENOUS
  Filled 2022-06-01: qty 100

## 2022-06-01 MED ORDER — PROPOFOL 10 MG/ML IV BOLUS
INTRAVENOUS | Status: DC | PRN
  Administered 2022-06-01: 140 mg via INTRAVENOUS

## 2022-06-01 MED ORDER — PHENYLEPHRINE HCL-NACL 20-0.9 MG/250ML-% IV SOLN
INTRAVENOUS | Status: DC | PRN
  Administered 2022-06-01: 50 ug/min via INTRAVENOUS

## 2022-06-01 MED ORDER — SODIUM CHLORIDE 0.9 % IV SOLN: INTRAVENOUS | Status: DC

## 2022-06-01 MED ORDER — FENTANYL CITRATE PF 50 MCG/ML IJ SOSY: 25.0000 ug | PREFILLED_SYRINGE | INTRAMUSCULAR | Status: DC | PRN

## 2022-06-01 MED ORDER — EPHEDRINE 5 MG/ML INJ
INTRAVENOUS | Status: AC
  Filled 2022-06-01: qty 5

## 2022-06-01 MED ORDER — CHLORHEXIDINE GLUCONATE 0.12 % MT SOLN
15.0000 mL | Freq: Once | OROMUCOSAL | Status: AC
  Administered 2022-06-01: 15 mL via OROMUCOSAL

## 2022-06-01 MED ORDER — CELECOXIB 200 MG PO CAPS
200.0000 mg | ORAL_CAPSULE | Freq: Once | ORAL | Status: AC
  Administered 2022-06-01: 200 mg via ORAL
  Filled 2022-06-01: qty 1

## 2022-06-01 MED ORDER — PROMETHAZINE HCL 25 MG/ML IJ SOLN: 6.2500 mg | INTRAMUSCULAR | Status: DC | PRN

## 2022-06-01 MED ORDER — LIDOCAINE HCL (CARDIAC) PF 100 MG/5ML IV SOSY
PREFILLED_SYRINGE | INTRAVENOUS | Status: DC | PRN
  Administered 2022-06-01: 50 mg via INTRAVENOUS

## 2022-06-01 MED ORDER — MORPHINE SULFATE (PF) 2 MG/ML IV SOLN: 0.5000 mg | INTRAVENOUS | Status: DC | PRN

## 2022-06-01 MED ORDER — MIDAZOLAM HCL 5 MG/5ML IJ SOLN
INTRAMUSCULAR | Status: DC | PRN
  Administered 2022-06-01: 1 mg via INTRAVENOUS

## 2022-06-01 MED ORDER — HYDROCODONE-ACETAMINOPHEN 7.5-325 MG PO TABS: 1.0000 | ORAL_TABLET | ORAL | Status: DC | PRN

## 2022-06-01 MED ORDER — LACTATED RINGERS IV SOLN: INTRAVENOUS | Status: DC

## 2022-06-01 MED ORDER — HYDROCODONE-ACETAMINOPHEN 5-325 MG PO TABS
1.0000 | ORAL_TABLET | ORAL | Status: DC | PRN
  Filled 2022-06-01: qty 2

## 2022-06-01 MED ORDER — AMISULPRIDE (ANTIEMETIC) 5 MG/2ML IV SOLN: 10.0000 mg | Freq: Once | INTRAVENOUS | Status: DC | PRN

## 2022-06-01 MED ORDER — DEXAMETHASONE SODIUM PHOSPHATE 10 MG/ML IJ SOLN
INTRAMUSCULAR | Status: DC | PRN
  Administered 2022-06-01: 2 mg
  Administered 2022-06-01: 3 mg
  Administered 2022-06-01: 10 mg

## 2022-06-01 MED ORDER — CEFAZOLIN SODIUM-DEXTROSE 1-4 GM/50ML-% IV SOLN
1.0000 g | Freq: Four times a day (QID) | INTRAVENOUS | Status: AC
  Administered 2022-06-01 – 2022-06-02 (×3): 1 g via INTRAVENOUS
  Filled 2022-06-01 (×3): qty 50

## 2022-06-01 MED ORDER — ACETAMINOPHEN 500 MG PO TABS
1000.0000 mg | ORAL_TABLET | Freq: Once | ORAL | Status: AC
  Administered 2022-06-01: 1000 mg via ORAL
  Filled 2022-06-01: qty 2

## 2022-06-01 MED ORDER — DEXAMETHASONE SODIUM PHOSPHATE 10 MG/ML IJ SOLN
INTRAMUSCULAR | Status: AC
Start: 2022-06-01 — End: ?
  Filled 2022-06-01: qty 1

## 2022-06-01 MED ORDER — METHOCARBAMOL 500 MG IVPB - SIMPLE MED: 500.0000 mg | Freq: Four times a day (QID) | INTRAVENOUS | Status: DC | PRN

## 2022-06-01 MED ORDER — ONDANSETRON HCL 4 MG/2ML IJ SOLN
INTRAMUSCULAR | Status: DC | PRN
  Administered 2022-06-01: 4 mg via INTRAVENOUS

## 2022-06-01 MED ORDER — DOCUSATE SODIUM 100 MG PO CAPS
100.0000 mg | ORAL_CAPSULE | Freq: Two times a day (BID) | ORAL | Status: DC
  Administered 2022-06-01 – 2022-06-02 (×2): 100 mg via ORAL
  Filled 2022-06-01 (×2): qty 1

## 2022-06-01 MED ORDER — FENTANYL CITRATE PF 50 MCG/ML IJ SOSY
50.0000 ug | PREFILLED_SYRINGE | INTRAMUSCULAR | Status: DC
  Administered 2022-06-01: 100 ug via INTRAVENOUS
  Filled 2022-06-01: qty 2

## 2022-06-01 MED ORDER — MIDAZOLAM HCL 2 MG/2ML IJ SOLN
INTRAMUSCULAR | Status: AC
  Filled 2022-06-01: qty 2

## 2022-06-01 MED ORDER — ONDANSETRON HCL 4 MG/2ML IJ SOLN: 4.0000 mg | Freq: Four times a day (QID) | INTRAMUSCULAR | Status: DC | PRN

## 2022-06-01 MED ORDER — METHOCARBAMOL 500 MG PO TABS
500.0000 mg | ORAL_TABLET | Freq: Four times a day (QID) | ORAL | Status: DC | PRN
  Administered 2022-06-02 (×2): 500 mg via ORAL
  Filled 2022-06-01 (×2): qty 1

## 2022-06-01 MED ORDER — DIPHENHYDRAMINE HCL 12.5 MG/5ML PO ELIX: 12.5000 mg | ORAL_SOLUTION | ORAL | Status: DC | PRN

## 2022-06-01 MED ORDER — FENTANYL CITRATE (PF) 100 MCG/2ML IJ SOLN
INTRAMUSCULAR | Status: DC | PRN
  Administered 2022-06-01: 50 ug via INTRAVENOUS

## 2022-06-01 MED ORDER — LIDOCAINE HCL (PF) 2 % IJ SOLN
INTRAMUSCULAR | Status: AC
Start: 2022-06-01 — End: ?
  Filled 2022-06-01: qty 5

## 2022-06-01 MED ORDER — PROPOFOL 10 MG/ML IV BOLUS
INTRAVENOUS | Status: AC
  Filled 2022-06-01: qty 20

## 2022-06-01 MED ORDER — ORAL CARE MOUTH RINSE
15.0000 mL | Freq: Once | OROMUCOSAL | Status: AC
Start: 2022-06-01 — End: 2022-06-01

## 2022-06-01 MED ORDER — ONDANSETRON HCL 4 MG/2ML IJ SOLN
INTRAMUSCULAR | Status: AC
  Filled 2022-06-01: qty 2

## 2022-06-01 MED ORDER — MIDAZOLAM HCL 2 MG/2ML IJ SOLN
1.0000 mg | INTRAMUSCULAR | Status: DC
  Administered 2022-06-01: 2 mg via INTRAVENOUS
  Filled 2022-06-01: qty 2

## 2022-06-01 MED ORDER — ONDANSETRON HCL 4 MG PO TABS: 4.0000 mg | ORAL_TABLET | Freq: Four times a day (QID) | ORAL | Status: DC | PRN

## 2022-06-01 MED ORDER — OXYCODONE HCL 5 MG PO TABS
5.0000 mg | ORAL_TABLET | Freq: Four times a day (QID) | ORAL | Status: DC | PRN
  Administered 2022-06-01: 10 mg via ORAL
  Administered 2022-06-02 (×2): 5 mg via ORAL
  Filled 2022-06-01: qty 1
  Filled 2022-06-01: qty 2
  Filled 2022-06-01: qty 1

## 2022-06-01 MED ORDER — ACETAMINOPHEN 325 MG PO TABS: 325.0000 mg | ORAL_TABLET | Freq: Four times a day (QID) | ORAL | Status: DC | PRN

## 2022-06-01 SURGICAL SUPPLY — 42 items
BAG COUNTER SPONGE SURGICOUNT (BAG) IMPLANT
BIT DRILL CANN 2.7 (BIT) ×2
BIT DRILL SRG 2.7XCANN AO CPLG (BIT) IMPLANT
BIT DRL SRG 2.7XCANN AO CPLNG (BIT) ×1
BNDG ELASTIC 4X5.8 VLCR STR LF (GAUZE/BANDAGES/DRESSINGS) ×1 IMPLANT
CLOTH BEACON ORANGE TIMEOUT ST (SAFETY) ×2 IMPLANT
COVER SURGICAL LIGHT HANDLE (MISCELLANEOUS) ×2 IMPLANT
CUFF TOURN SGL QUICK 34 (TOURNIQUET CUFF) ×2
CUFF TRNQT CYL 34X4.125X (TOURNIQUET CUFF) ×1 IMPLANT
DRAPE C-ARM 42X120 X-RAY (DRAPES) ×2 IMPLANT
DRAPE C-ARMOR (DRAPES) ×2 IMPLANT
DRAPE U-SHAPE 47X51 STRL (DRAPES) ×2 IMPLANT
DRSG ADAPTIC 3X8 NADH LF (GAUZE/BANDAGES/DRESSINGS) ×2 IMPLANT
DRSG PAD ABDOMINAL 8X10 ST (GAUZE/BANDAGES/DRESSINGS) ×2 IMPLANT
DURAPREP 26ML APPLICATOR (WOUND CARE) ×2 IMPLANT
ELECT REM PT RETURN 15FT ADLT (MISCELLANEOUS) ×2 IMPLANT
GAUZE PAD ABD 8X10 STRL (GAUZE/BANDAGES/DRESSINGS) ×1 IMPLANT
GAUZE SPONGE 4X4 12PLY STRL (GAUZE/BANDAGES/DRESSINGS) ×1 IMPLANT
GAUZE XEROFORM 1X8 LF (GAUZE/BANDAGES/DRESSINGS) ×1 IMPLANT
GLOVE BIO SURGEON STRL SZ7.5 (GLOVE) ×2 IMPLANT
GLOVE BIOGEL PI IND STRL 8 (GLOVE) ×2 IMPLANT
GLOVE BIOGEL PI INDICATOR 8 (GLOVE) ×2
GLOVE ECLIPSE 8.0 STRL XLNG CF (GLOVE) ×2 IMPLANT
GOWN STRL REUS W/ TWL XL LVL3 (GOWN DISPOSABLE) ×2 IMPLANT
GOWN STRL REUS W/TWL XL LVL3 (GOWN DISPOSABLE) ×4
K-WIRE ORTHOPEDIC 1.4X150L (WIRE) ×4
KIT BASIN OR (CUSTOM PROCEDURE TRAY) ×2 IMPLANT
KIT TURNOVER KIT A (KITS) IMPLANT
KWIRE ORTHOPEDIC 1.4X150L (WIRE) IMPLANT
PACK ORTHO EXTREMITY (CUSTOM PROCEDURE TRAY) ×2 IMPLANT
PAD CAST 4YDX4 CTTN HI CHSV (CAST SUPPLIES) IMPLANT
PADDING CAST COTTON 4X4 STRL (CAST SUPPLIES) ×1
PROTECTOR NERVE ULNAR (MISCELLANEOUS) ×2 IMPLANT
SCREW 42X4.0MM (Screw) ×2 IMPLANT
SPIKE FLUID TRANSFER (MISCELLANEOUS) ×2 IMPLANT
SUT ETHILON 2 0 PS N (SUTURE) ×2 IMPLANT
SUT VIC AB 0 CT1 36 (SUTURE) ×2 IMPLANT
SUT VIC AB 2-0 CT1 27 (SUTURE) ×1
SUT VIC AB 2-0 CT1 TAPERPNT 27 (SUTURE) ×1 IMPLANT
SYR CONTROL 10ML LL (SYRINGE) ×2 IMPLANT
TOWEL OR 17X26 10 PK STRL BLUE (TOWEL DISPOSABLE) ×4 IMPLANT
TOWEL OR NON WOVEN STRL DISP B (DISPOSABLE) ×2 IMPLANT

## 2022-06-01 NOTE — Anesthesia Procedure Notes (Signed)
Procedure Name: LMA Insertion Date/Time: 06/01/2022 1:43 PM  Performed by: Doran Clay, CRNAPre-anesthesia Checklist: Patient identified, Emergency Drugs available, Suction available, Patient being monitored and Timeout performed Patient Re-evaluated:Patient Re-evaluated prior to induction Oxygen Delivery Method: Circle system utilized Preoxygenation: Pre-oxygenation with 100% oxygen Induction Type: IV induction LMA: LMA inserted LMA Size: 4.0 Tube type: Oral Number of attempts: 1 Placement Confirmation: positive ETCO2 and breath sounds checked- equal and bilateral Tube secured with: Tape Dental Injury: Teeth and Oropharynx as per pre-operative assessment

## 2022-06-01 NOTE — Transfer of Care (Signed)
Immediate Anesthesia Transfer of Care Note  Patient: Tara Bray  Procedure(s) Performed: OPEN REDUCTION INTERNAL FIXATION (ORIF) LEFT ANKLE MEDIAL MALLEOLUS FRACTURE (Left: Ankle)  Patient Location: PACU  Anesthesia Type:General  Level of Consciousness: sedated  Airway & Oxygen Therapy: Patient Spontanous Breathing and Patient connected to face mask oxygen  Post-op Assessment: Report given to RN and Post -op Vital signs reviewed and stable  Post vital signs: Reviewed and stable  Last Vitals:  Vitals Value Taken Time  BP 102/81 06/01/22 1451  Temp 36.4 C 06/01/22 1451  Pulse 76 06/01/22 1452  Resp 12 06/01/22 1452  SpO2 100 % 06/01/22 1452  Vitals shown include unvalidated device data.  Last Pain:  Vitals:   06/01/22 1315  TempSrc:   PainSc: 0-No pain      Patients Stated Pain Goal: 5 (06/01/22 1315)  Complications: No notable events documented.

## 2022-06-01 NOTE — Discharge Instructions (Addendum)
You may put all of your weight on your left ankle as comfort allows. Do expect ankle swelling -ice and elevation as needed. Do try to bend your ankle back-and-forth on your own several times a day. Keep your dressing clean and dry. Take a full strength 325 mg aspirin daily to help prevent blood clots.

## 2022-06-01 NOTE — Brief Op Note (Signed)
06/01/2022  2:39 PM  PATIENT:  Tara Bray  51 y.o. female  PRE-OPERATIVE DIAGNOSIS:  left ankle medial malleolus fracture  POST-OPERATIVE DIAGNOSIS:  left ankle medial malleolus fracture  PROCEDURE:  Procedure(s): OPEN REDUCTION INTERNAL FIXATION (ORIF) LEFT ANKLE MEDIAL MALLEOLUS FRACTURE (Left)  SURGEON:  Surgeon(s) and Role:    Kathryne Hitch, MD - Primary  PHYSICIAN ASSISTANT: Rexene Edison, PA-C   ANESTHESIA:   regional and general  COUNTS:  YES  TOURNIQUET:  * Missing tourniquet times found for documented tourniquets in log: [id number] *  DICTATION: .Other Dictation: Dictation Number [id number]  PLAN OF CARE: Admit for overnight observation  PATIENT DISPOSITION:  PACU - hemodynamically stable.   Delay start of Pharmacological VTE agent (>24hrs) due to surgical blood loss or risk of bleeding: no

## 2022-06-01 NOTE — Anesthesia Procedure Notes (Signed)
Anesthesia Regional Block: Popliteal block   Pre-Anesthetic Checklist: , timeout performed,  Correct Patient, Correct Site, Correct Laterality,  Correct Procedure, Correct Position, site marked,  Risks and benefits discussed,  Surgical consent,  Pre-op evaluation,  At surgeon's request and post-op pain management  Laterality: Left  Prep: chloraprep       Needles:  Injection technique: Single-shot  Needle Type: Echogenic Stimulator Needle          Additional Needles:   Procedures:,,,, ultrasound used (permanent image in chart),,    Narrative:  Start time: 06/01/2022 1:14 PM End time: 06/01/2022 1:24 PM Injection made incrementally with aspirations every 5 mL.  Performed by: Personally  Anesthesiologist: Heather Roberts, MD  Additional Notes: A functioning IV was confirmed and monitors were applied.  Sterile prep and drape, hand hygiene and sterile gloves were used.  Negative aspiration and test dose prior to incremental administration of local anesthetic. The patient tolerated the procedure well.Ultrasound  guidance: relevant anatomy identified, needle position confirmed, local anesthetic spread visualized around nerve(s), vascular puncture avoided.  Image printed for medical record.

## 2022-06-01 NOTE — Plan of Care (Signed)
Plan of care reviewed and discussed with the patient. 
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
## 2022-06-01 NOTE — Progress Notes (Signed)
Assisted Dr. Krista Blue with left, adductor canal, popliteal block. Side rails up, monitors on throughout procedure. See vital signs in flow sheet. Tolerated Procedure well.

## 2022-06-01 NOTE — Anesthesia Postprocedure Evaluation (Signed)
Anesthesia Post Note  Patient: Tara Bray  Procedure(s) Performed: OPEN REDUCTION INTERNAL FIXATION (ORIF) LEFT ANKLE MEDIAL MALLEOLUS FRACTURE (Left: Ankle)     Patient location during evaluation: PACU Anesthesia Type: General Level of consciousness: sedated Pain management: pain level controlled Vital Signs Assessment: post-procedure vital signs reviewed and stable Respiratory status: spontaneous breathing and respiratory function stable Cardiovascular status: stable Postop Assessment: no apparent nausea or vomiting Anesthetic complications: no   No notable events documented.  Last Vitals:  Vitals:   06/01/22 1530 06/01/22 1556  BP: 116/71 115/72  Pulse: 79 65  Resp: 16 16  Temp:  36.6 C  SpO2: 97% 100%    Last Pain:  Vitals:   06/01/22 1556  TempSrc: Oral  PainSc:                  Lyndzee Kliebert DANIEL

## 2022-06-02 DIAGNOSIS — S8252XA Displaced fracture of medial malleolus of left tibia, initial encounter for closed fracture: Secondary | ICD-10-CM | POA: Diagnosis not present

## 2022-06-02 MED ORDER — ASPIRIN 325 MG PO TABS: 325.0000 mg | ORAL_TABLET | Freq: Every day | ORAL | 0 refills | Status: DC

## 2022-06-02 MED ORDER — OXYCODONE HCL 5 MG PO TABS: 5.0000 mg | ORAL_TABLET | Freq: Four times a day (QID) | ORAL | 0 refills | Status: DC | PRN

## 2022-06-02 MED ORDER — ASPIRIN 81 MG PO CHEW: 81.0000 mg | CHEWABLE_TABLET | Freq: Two times a day (BID) | ORAL | 0 refills | Status: DC

## 2022-06-02 NOTE — TOC Progression Note (Signed)
Transition of Care [facility]) - Progression Note    Patient Details  Name: Tara Bray MRN: [id number] Date of Birth: 02-21-1971  Transition of Care [facility]) CM/SW Contact  Darleene Cleaver, [place] Phone Number: 06/02/2022, 12:52 PM  Clinical Narrative:     CSW was informed that patient needs a youth rolling walker.  CSW requested order from bedside nurse, CSW notified Jasmine at [employer] about patient needing youth walker.          Expected Discharge Plan and Services                                                 Social Determinants of Health (SDOH) Interventions    Readmission Risk Interventions     No data to display
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
## 2022-06-02 NOTE — Plan of Care (Signed)
  Problem: Clinical Measurements: Goal: Respiratory complications will improve Outcome: Progressing   Problem: Clinical Measurements: Goal: Cardiovascular complication will be avoided Outcome: Progressing   Problem: Pain Managment: Goal: General experience of comfort will improve Outcome: Progressing   

## 2022-06-02 NOTE — Plan of Care (Signed)
Pt stable at time of d/c instructions and education. Pt dressing clean, dry, and intact. Family at bedside.

## 2022-06-02 NOTE — Progress Notes (Signed)
[employer] notified of need for youth rolling walker and also need for d/c order as well. Pt did well with physical therapy. Pt remains stable.
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
## 2022-06-02 NOTE — TOC Transition Note (Signed)
Transition of Care [facility]) - CM/SW Discharge Note   Patient Details  Name: Tara Bray MRN: [id number] Date of Birth: 1971/10/01  [facility] CM/SW Contact:  Darleene Cleaver, LCSW Phone Number: 06/02/2022, 1:11 PM   Clinical Narrative:      CSW was informed that patient needs a youth rolling walker.  CSW requested order from bedside nurse, CSW notified Jasmine at [employer] about patient needing youth walker.          Patient Goals and CMS Choice    To return back home.    Discharge Placement                       Discharge Plan and Services                                     Social Determinants of Health (SDOH) Interventions     Readmission Risk Interventions     No data to display
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
## 2022-06-04 ENCOUNTER — Encounter (HOSPITAL_COMMUNITY): Payer: Self-pay | Admitting: Orthopaedic Surgery

## 2022-06-18 ENCOUNTER — Ambulatory Visit (INDEPENDENT_AMBULATORY_CARE_PROVIDER_SITE_OTHER): Payer: BC Managed Care – PPO

## 2022-06-18 ENCOUNTER — Encounter: Payer: Self-pay | Admitting: Orthopaedic Surgery

## 2022-06-18 ENCOUNTER — Ambulatory Visit (INDEPENDENT_AMBULATORY_CARE_PROVIDER_SITE_OTHER): Payer: BC Managed Care – PPO | Admitting: Orthopaedic Surgery

## 2022-06-18 DIAGNOSIS — M25572 Pain in left ankle and joints of left foot: Secondary | ICD-10-CM | POA: Diagnosis not present

## 2022-06-18 MED ORDER — OXYCODONE HCL 5 MG PO TABS: 5.0000 mg | ORAL_TABLET | Freq: Four times a day (QID) | ORAL | 0 refills | Status: DC | PRN

## 2022-06-18 NOTE — Progress Notes (Signed)
The patient is now 2 weeks out from open reduction/central fixation of a displaced left ankle medial malleolus fracture.  She has been weightbearing in a cam walking boot.  She does need a refill of pain medication but is doing well overall.  I did assess the left ankle incision.  Medially looks great.  I remove the sutures in place Steri-Strips.  She has decent range of motion of that ankle.  X-rays obtained today show the medial malleolus fracture is in good position with 2 intact screws.  She will continue to weight-bear as tolerated in a cam walking boot.  I remove the sutures in place Steri-Strips.  She can get this wet in shower start tomorrow.  She can transition out of the boot over the next few weeks.  We will see her back in 4 weeks to see how she is doing overall with repeat 3 views of the left ankle.  At that point she will not need a cam walking boot and may need therapy if needed.  I did refill her oxycodone.
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
## 2022-07-19 ENCOUNTER — Ambulatory Visit (INDEPENDENT_AMBULATORY_CARE_PROVIDER_SITE_OTHER): Payer: BC Managed Care – PPO

## 2022-07-19 ENCOUNTER — Encounter: Payer: Self-pay | Admitting: Orthopaedic Surgery

## 2022-07-19 ENCOUNTER — Ambulatory Visit (INDEPENDENT_AMBULATORY_CARE_PROVIDER_SITE_OTHER): Payer: BC Managed Care – PPO | Admitting: Orthopaedic Surgery

## 2022-07-19 DIAGNOSIS — Z8781 Personal history of (healed) traumatic fracture: Secondary | ICD-10-CM

## 2022-07-19 DIAGNOSIS — Z9889 Other specified postprocedural states: Secondary | ICD-10-CM

## 2022-07-19 DIAGNOSIS — S8252XA Displaced fracture of medial malleolus of left tibia, initial encounter for closed fracture: Secondary | ICD-10-CM

## 2022-07-19 MED ORDER — PREDNISONE 50 MG PO TABS: ORAL_TABLET | ORAL | 0 refills | Status: DC

## 2022-07-19 NOTE — Progress Notes (Signed)
The patient is now almost 7 weeks status post open reduction/central fixation of the medial malleolus fracture of the left ankle.  She had a small avulsion over the lateral malleolus and that was really causing her problems.  Her left lateral ankle swells but the medial side she says she has no issues with.  She has been weightbearing as tolerated in a cam walking boot.  On exam she is tender over the lateral malleolus and there is swelling but no redness.  Her calf is soft.  Her medial incision is healed nicely and she is able to flex and extend at the left ankle.  3 views left ankle show the medial malleolus fracture is healing nicely.  The ankle mortise is congruent.  There is a small avulsion fracture off the lateral cortex of the fibula.  There is soft tissue swelling to be seen easily on the plain films as well.  I will let her continue to weight-bear as tolerated but we need to transition her to an ASO.  Iam going to try a short course of prednisone for inflammation as well as Voltaren gel.  I would like to see her back in just 2 weeks for repeat exam but no x-rays are needed.  At that point we may consider outpatient physical therapy if needed.  She agrees with this treatment plan.

## 2022-08-02 ENCOUNTER — Ambulatory Visit (INDEPENDENT_AMBULATORY_CARE_PROVIDER_SITE_OTHER): Payer: BC Managed Care – PPO | Admitting: Physician Assistant

## 2022-08-02 ENCOUNTER — Encounter: Payer: Self-pay | Admitting: Physician Assistant

## 2022-08-02 DIAGNOSIS — Z8781 Personal history of (healed) traumatic fracture: Secondary | ICD-10-CM

## 2022-08-02 DIAGNOSIS — Z9889 Other specified postprocedural states: Secondary | ICD-10-CM

## 2022-08-02 NOTE — Progress Notes (Signed)
HPI: Ms. Frontera returns today for follow-up left medial malleolus ORIF 06/01/2022.  She states she is having pain and swelling in the ankle mostly to the lateral aspect of the ankle.  She had no new injury.  She still in a cam walker boot.  She has been denied physical therapy as of yet.  Review of systems see HPI otherwise negative.  Physical exam: Left ankle surgical incisions well-healed.  Tenderness over the lateral malleolus.  She has 5 out of 5 strength with inversion eversion against resistance left foot.  Limited dorsiflexion plantarflexion left ankle compared to the right which is full.  Left calf supple nontender.  Left lower leg slight edema compared to the right no ecchymosis erythema impending ulcers.  Impression: Status post left ankle medial malleolus ORIF  Plan: Recommend she wear compression stocking during the day to help with swelling.  She will begin using her ASO brace which she was given at last office visit this is placed on her today and she states she understands how to put the brace on and off.  She is given a prescription for physical therapy to work on range of motion strengthening of the left ankle.  Questions encouraged and answered.  We will see her back in 1 month.

## 2022-08-07 ENCOUNTER — Telehealth: Payer: Self-pay | Admitting: Radiology

## 2022-08-07 NOTE — Telephone Encounter (Signed)
Rx faxed

## 2022-09-03 ENCOUNTER — Encounter: Payer: BC Managed Care – PPO | Admitting: Physician Assistant

## 2022-09-13 ENCOUNTER — Encounter: Payer: Self-pay | Admitting: Orthopaedic Surgery

## 2022-09-13 ENCOUNTER — Ambulatory Visit (INDEPENDENT_AMBULATORY_CARE_PROVIDER_SITE_OTHER): Payer: Self-pay | Admitting: Orthopaedic Surgery

## 2022-09-13 ENCOUNTER — Ambulatory Visit (INDEPENDENT_AMBULATORY_CARE_PROVIDER_SITE_OTHER): Payer: Self-pay

## 2022-09-13 DIAGNOSIS — Z8781 Personal history of (healed) traumatic fracture: Secondary | ICD-10-CM

## 2022-09-13 DIAGNOSIS — Z9889 Other specified postprocedural states: Secondary | ICD-10-CM
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
## 2022-12-12 ENCOUNTER — Ambulatory Visit (INDEPENDENT_AMBULATORY_CARE_PROVIDER_SITE_OTHER): Payer: Self-pay | Admitting: Orthopaedic Surgery

## 2022-12-12 ENCOUNTER — Encounter: Payer: Self-pay | Admitting: Orthopaedic Surgery

## 2022-12-12 DIAGNOSIS — Z9889 Other specified postprocedural states: Secondary | ICD-10-CM

## 2022-12-12 DIAGNOSIS — Z8781 Personal history of (healed) traumatic fracture: Secondary | ICD-10-CM

## 2022-12-12 DIAGNOSIS — M25572 Pain in left ankle and joints of left foot: Secondary | ICD-10-CM

## 2022-12-12 MED ORDER — DICLOFENAC SODIUM 75 MG PO TBEC: 75.0000 mg | DELAYED_RELEASE_TABLET | Freq: Two times a day (BID) | ORAL | 3 refills | Status: DC | PRN

## 2022-12-12 NOTE — Progress Notes (Signed)
The patient is now just over 6 months status post open reduction/internal fixation of a displaced left ankle minimalize fracture.  She is doing well overall.  If she needs a note stating that she can return to full work duties without restrictions starting January 22.  I did give her that note today.  She still reports lateral ankle pain.  She does not have any type of significant fracture of the lateral aspect of her ankle that required surgery.  Her left ankle does have some slight lateral swelling and anterior swelling.  The medial side shows no swelling.  Her range of motion is entirely full and her ankle is stable from a ligament standpoint.  I will send in diclofenac as an anti-inflammatory for her.  Hopefully with time the inflammation will decrease with her ankle.  We can see her back in 3 months.  Will have it 3 views of her left ankle at that visit.  All questions and concerns were answered and addressed.

## 2022-12-26 ENCOUNTER — Telehealth: Payer: Self-pay | Admitting: Orthopaedic Surgery

## 2022-12-26 NOTE — Telephone Encounter (Signed)
Tried calling pt to advise. No answer and no way to leave a voice mail

## 2022-12-26 NOTE — Telephone Encounter (Signed)
Pt called requesting a work note to say light duty. She states her job requires a lot of walking and her ankle that she had surgery on with Dr Ninfa Linden has swollen caused from walking. Please call pt about this matter at [phone].

## 2022-12-26 NOTE — Telephone Encounter (Signed)
Note completed and placed at the front desk 

## 2023-03-13 ENCOUNTER — Ambulatory Visit: Payer: Self-pay | Admitting: Orthopaedic Surgery

## 2023-05-08 ENCOUNTER — Other Ambulatory Visit: Payer: Self-pay

## 2023-05-08 ENCOUNTER — Ambulatory Visit (INDEPENDENT_AMBULATORY_CARE_PROVIDER_SITE_OTHER): Payer: Self-pay | Admitting: Orthopaedic Surgery

## 2023-05-08 ENCOUNTER — Other Ambulatory Visit (INDEPENDENT_AMBULATORY_CARE_PROVIDER_SITE_OTHER): Payer: Self-pay

## 2023-05-08 ENCOUNTER — Encounter: Payer: Self-pay | Admitting: Orthopaedic Surgery

## 2023-05-08 DIAGNOSIS — Z9889 Other specified postprocedural states: Secondary | ICD-10-CM

## 2023-05-08 DIAGNOSIS — M25572 Pain in left ankle and joints of left foot: Secondary | ICD-10-CM

## 2023-05-08 DIAGNOSIS — Z8781 Personal history of (healed) traumatic fracture: Secondary | ICD-10-CM

## 2023-05-08 NOTE — Progress Notes (Signed)
The patient is almost a year out from open reduction/intra fixation of the left ankle medial malleolus fracture.  She comes in today complaining of pain along the lateral aspect of her foot and ankle.  She says she has no pain medially.  She still feels like there is a lot of stiffness in her foot and toes in general.  On exam she has excellent range of motion of her left foot and ankle but there is definitely weakness with the peroneal tendons.  There is pain along the course of the lateral malleolus and the fibula toward the foot itself.  There is no pain medially.  Her Achilles is not tight.  3 views of the left ankle show the hardware is intact from her medial malleolus fracture that is healed completely.  The ankle mortise appears congruent.  There is some shortening of the fibula but there was never a fibular fracture.  I would like to send her for an MRI of her left ankle to assess the tendons and cartilage around the left ankle.  We will then see her back in follow-up to go over the MRI and come up with a treatment plan.

## 2023-07-15 IMAGING — CT CT HEAD W/O CM
3 series · 16 of 46 positions shown, 19 images · non-contrast
Comparison: February 05, 2014.

CLINICAL DATA: Headache, blurry vision.

EXAM:
CT HEAD WITHOUT CONTRAST
TECHNIQUE: Contiguous axial images were obtained from the base of the skull
through the vertex without intravenous contrast.

[Series 2: head wo · axial · 0.40mm/px · z∈[-61,+59]mm · 10 of 29 slices shown, 13 images]
[im 3/29  brain]
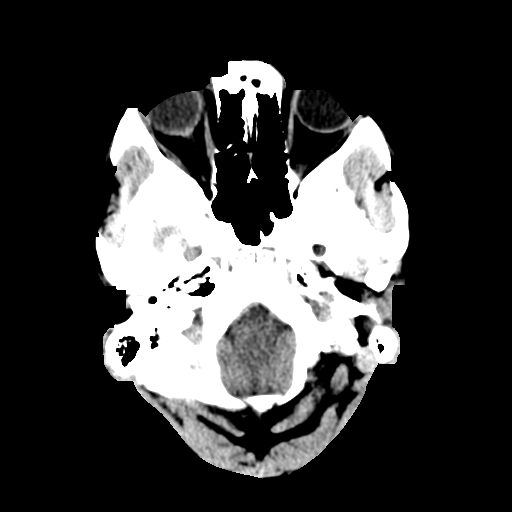
[im 3/29  bone]
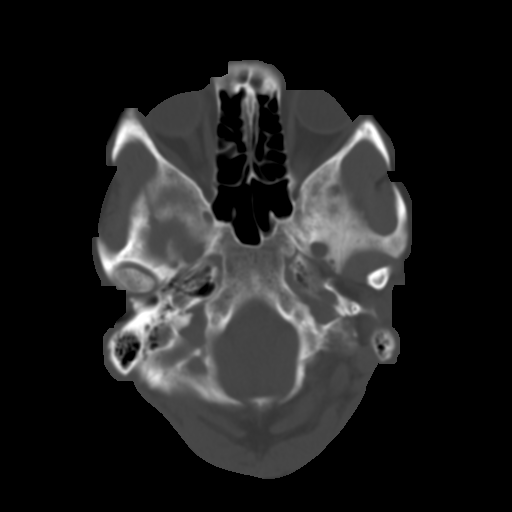
[im 6/29  brain]
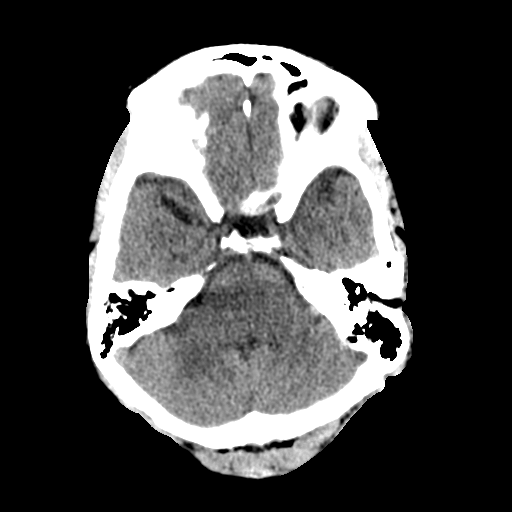
[im 8/29  brain]
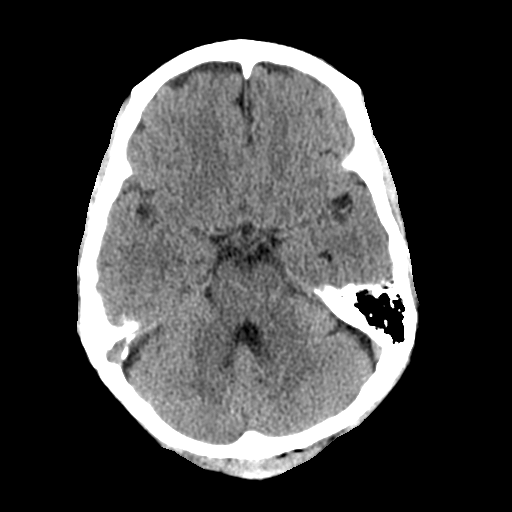
[im 11/29  brain]
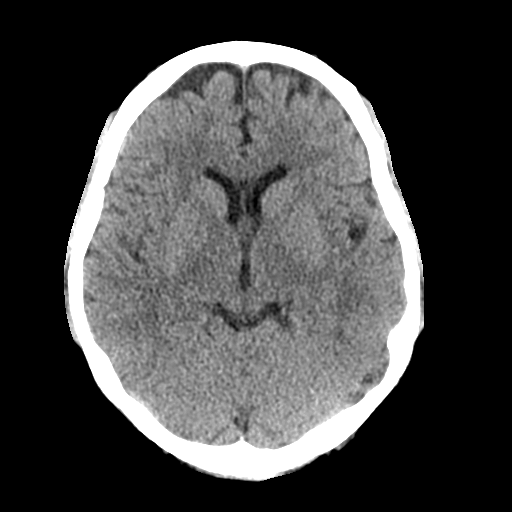
[im 14/29  brain]
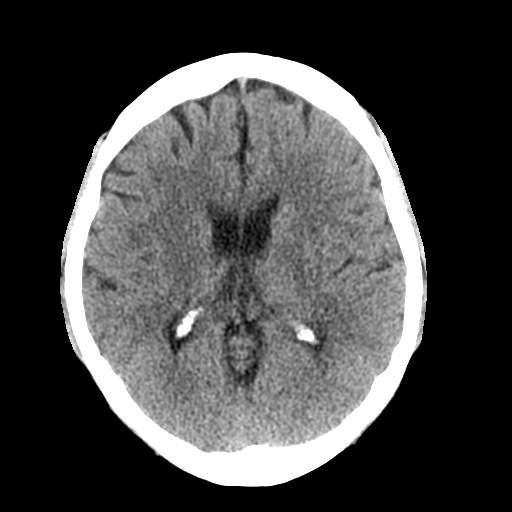
[im 14/29  bone]
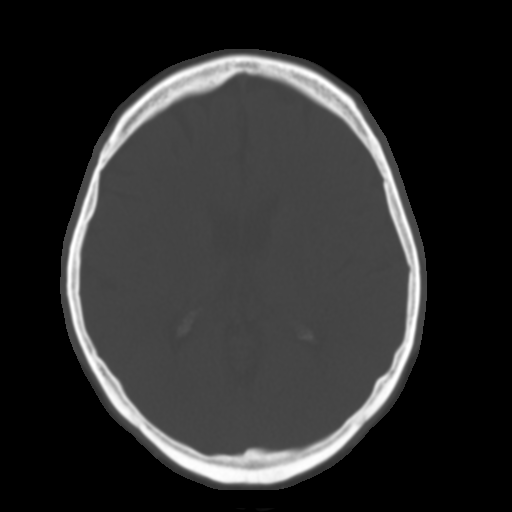
[im 16/29  brain]
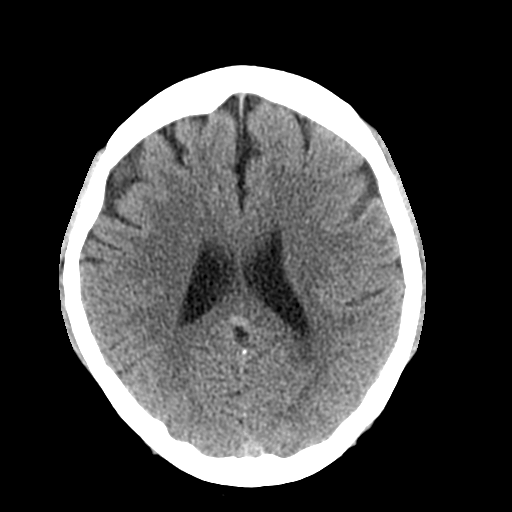
[im 19/29  brain]
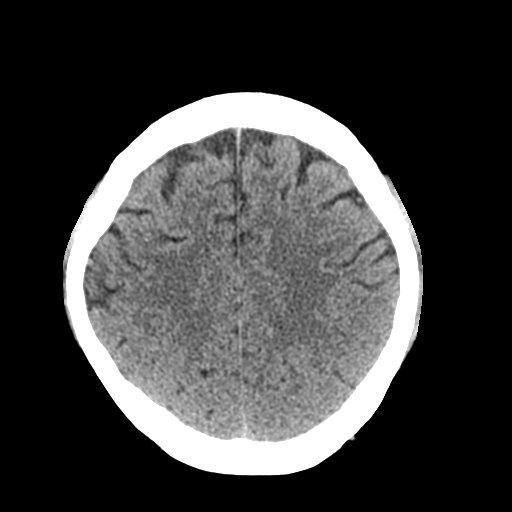
[im 22/29  brain]
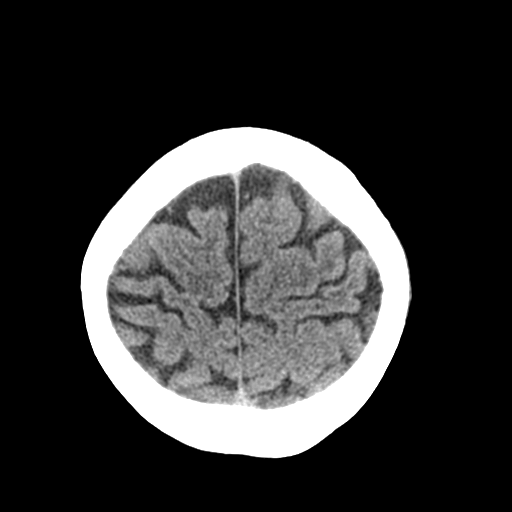
[im 24/29  brain]
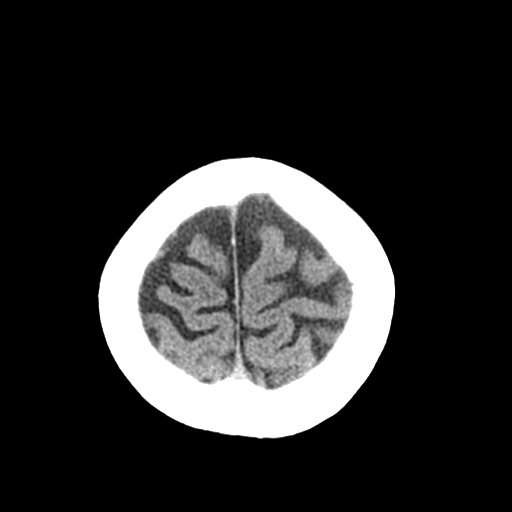
[im 24/29  bone]
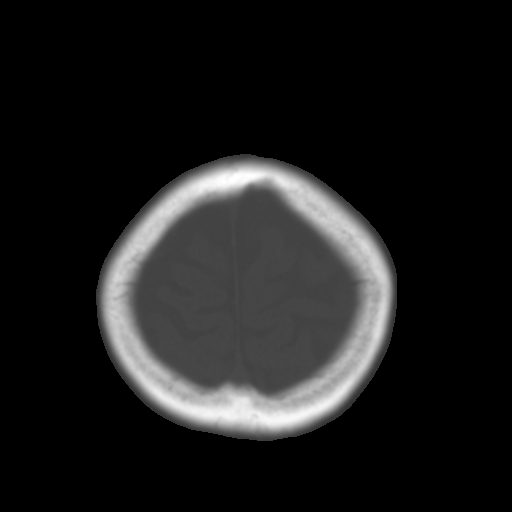
[im 27/29  brain]
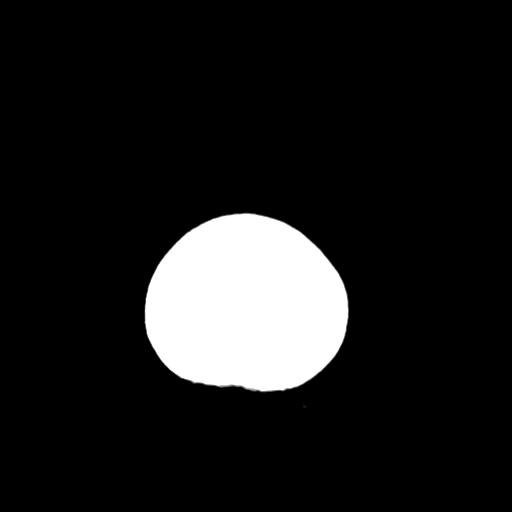

[Series 4: coronal soft tissue · coronal · 0.31mm/px · 3 of 68 slices shown]
[im 23/68  brain]
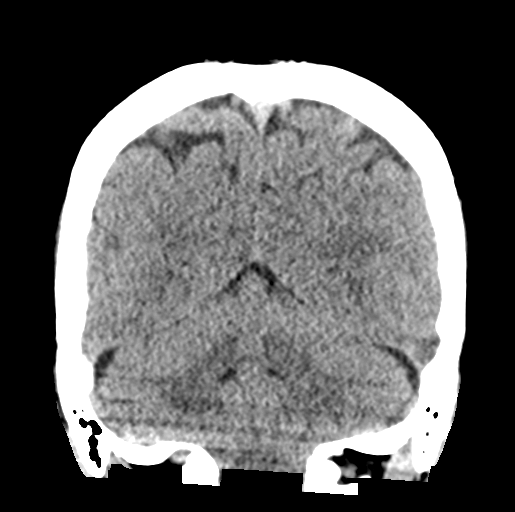
[im 30/68  brain]
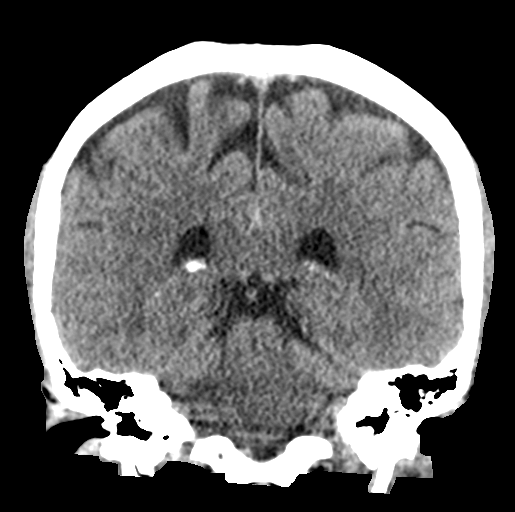
[im 38/68  brain]
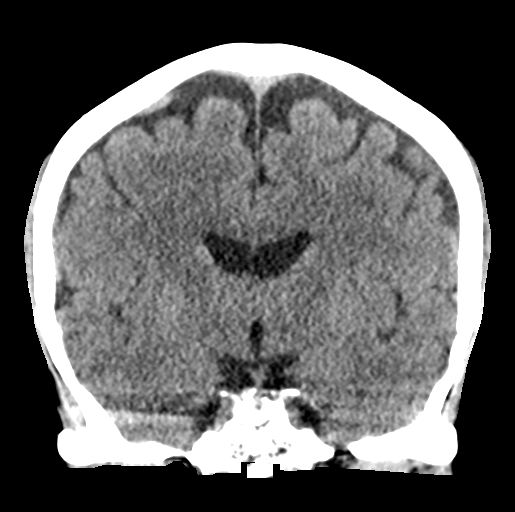

[Series 5: sagittal soft tissue · sagittal · 0.31mm/px · 3 of 54 slices shown]
[im 18/54  brain]
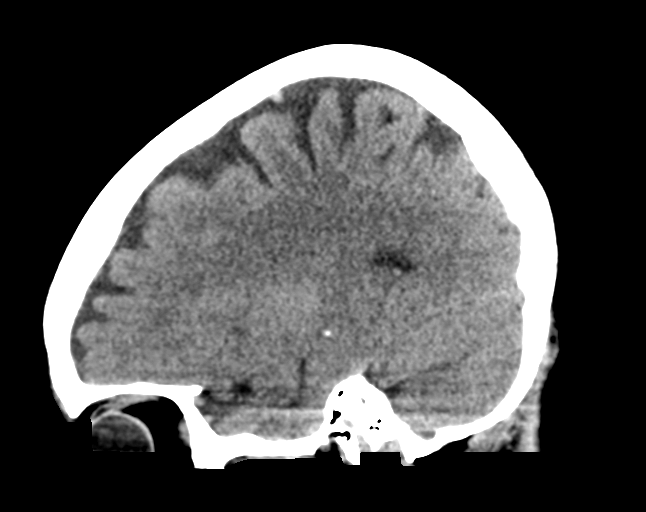
[im 27/54  brain]
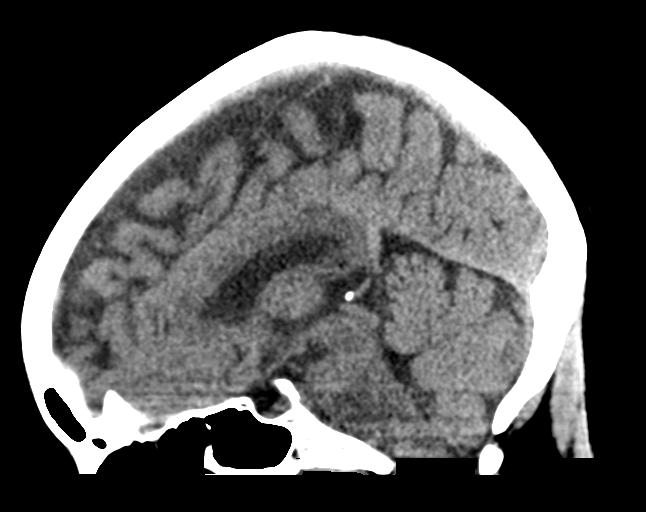
[im 36/54  brain]
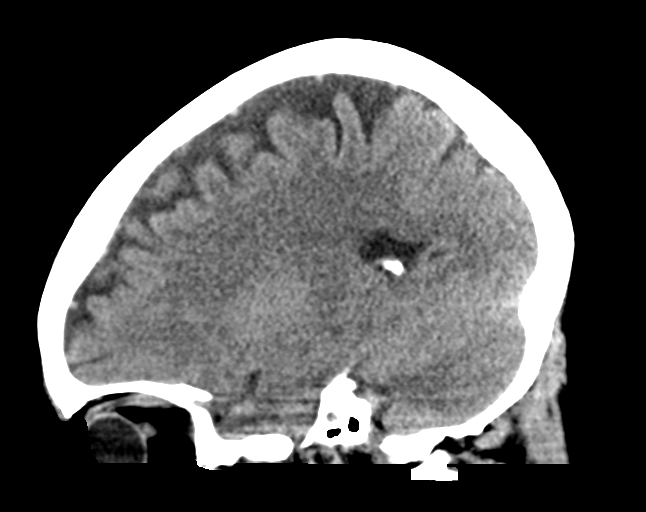

[16 of 46 positions shown; findings below may reference images not displayed]

FINDINGS: Brain: No evidence of acute infarction, hemorrhage, hydrocephalus,
extra-axial collection or mass lesion/mass effect.

Vascular: No hyperdense vessel or unexpected calcification.

Skull: Normal. Negative for fracture or focal lesion.

Sinuses/Orbits: No acute finding.

Other: None.
IMPRESSION: No acute intracranial abnormality seen.

## 2023-12-03 ENCOUNTER — Other Ambulatory Visit: Payer: Self-pay | Admitting: *Deleted

## 2023-12-03 DIAGNOSIS — I872 Venous insufficiency (chronic) (peripheral): Secondary | ICD-10-CM

## 2023-12-03 DIAGNOSIS — I890 Lymphedema, not elsewhere classified: Secondary | ICD-10-CM
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
## 2023-12-10 ENCOUNTER — Encounter: Payer: Self-pay | Admitting: Vascular Surgery

## 2023-12-10 ENCOUNTER — Ambulatory Visit (INDEPENDENT_AMBULATORY_CARE_PROVIDER_SITE_OTHER): Payer: Medicaid Other | Admitting: Vascular Surgery

## 2023-12-10 ENCOUNTER — Ambulatory Visit (HOSPITAL_COMMUNITY)
Admission: RE | Admit: 2023-12-10 | Discharge: 2023-12-10 | Disposition: A | Discharge status: Home / Self Care | Payer: Medicaid Other | Source: Ambulatory Visit | Attending: Vascular Surgery | Admitting: Vascular Surgery

## 2023-12-10 VITALS — BP 111/78 | HR 64 | Temp 97.9°F | Resp 20 | Ht 60.0 in | Wt 163.0 lb

## 2023-12-10 DIAGNOSIS — I872 Venous insufficiency (chronic) (peripheral): Secondary | ICD-10-CM | POA: Insufficient documentation

## 2023-12-10 DIAGNOSIS — I890 Lymphedema, not elsewhere classified: Secondary | ICD-10-CM | POA: Insufficient documentation

## 2024-01-13 ENCOUNTER — Encounter (HOSPITAL_COMMUNITY): Payer: Self-pay

## 2024-01-13 ENCOUNTER — Other Ambulatory Visit: Payer: Self-pay

## 2024-01-13 ENCOUNTER — Emergency Department (HOSPITAL_COMMUNITY): Payer: Medicaid Other

## 2024-01-13 ENCOUNTER — Emergency Department (HOSPITAL_COMMUNITY)
Admission: EM | Admit: 2024-01-13 | Discharge: 2024-01-13 | Disposition: A | Discharge status: Home / Self Care | Payer: Medicaid Other | Attending: Emergency Medicine | Admitting: Emergency Medicine

## 2024-01-13 DIAGNOSIS — Z20822 Contact with and (suspected) exposure to covid-19: Secondary | ICD-10-CM | POA: Insufficient documentation

## 2024-01-13 DIAGNOSIS — J0100 Acute maxillary sinusitis, unspecified: Secondary | ICD-10-CM | POA: Insufficient documentation

## 2024-01-13 DIAGNOSIS — J168 Pneumonia due to other specified infectious organisms: Secondary | ICD-10-CM | POA: Diagnosis not present

## 2024-01-13 DIAGNOSIS — J189 Pneumonia, unspecified organism: Secondary | ICD-10-CM

## 2024-01-13 DIAGNOSIS — R059 Cough, unspecified: Secondary | ICD-10-CM | POA: Diagnosis present

## 2024-01-13 LAB — CBC WITH DIFFERENTIAL/PLATELET
Abs Immature Granulocytes: 0 10*3/uL (ref 0.00–0.07)
Basophils Absolute: 0 10*3/uL (ref 0.0–0.1)
Basophils Relative: 0 %
Eosinophils Absolute: 0.1 10*3/uL (ref 0.0–0.5)
Eosinophils Relative: 1 %
HCT: 46.2 % — ABNORMAL HIGH (ref 36.0–46.0)
Hemoglobin: 15.3 g/dL — ABNORMAL HIGH (ref 12.0–15.0)
Lymphocytes Relative: 30 %
Lymphs Abs: 3.9 10*3/uL (ref 0.7–4.0)
MCH: 30 pg (ref 26.0–34.0)
MCHC: 33.1 g/dL (ref 30.0–36.0)
MCV: 90.6 fL (ref 80.0–100.0)
Monocytes Absolute: 1.2 10*3/uL — ABNORMAL HIGH (ref 0.1–1.0)
Monocytes Relative: 9 %
Neutro Abs: 7.7 10*3/uL (ref 1.7–7.7)
Neutrophils Relative %: 60 %
Platelets: 200 10*3/uL (ref 150–400)
RBC: 5.1 MIL/uL (ref 3.87–5.11)
RDW: 13.2 % (ref 11.5–15.5)
WBC: 12.9 10*3/uL — ABNORMAL HIGH (ref 4.0–10.5)
nRBC: 0 % (ref 0.0–0.2)

## 2024-01-13 LAB — RESP PANEL BY RT-PCR (RSV, FLU A&B, COVID)  RVPGX2
Influenza A by PCR: NEGATIVE
Influenza B by PCR: NEGATIVE
Resp Syncytial Virus by PCR: NEGATIVE
SARS Coronavirus 2 by RT PCR: NEGATIVE

## 2024-01-13 LAB — PROTIME-INR
INR: 1 (ref 0.8–1.2)
Prothrombin Time: 13.1 s (ref 11.4–15.2)

## 2024-01-13 LAB — COMPREHENSIVE METABOLIC PANEL
ALT: 34 U/L (ref 0–44)
AST: 19 U/L (ref 15–41)
Albumin: 3.7 g/dL (ref 3.5–5.0)
Alkaline Phosphatase: 87 U/L (ref 38–126)
Anion gap: 12 (ref 5–15)
BUN: 9 mg/dL (ref 6–20)
CO2: 27 mmol/L (ref 22–32)
Calcium: 11.9 mg/dL — ABNORMAL HIGH (ref 8.9–10.3)
Chloride: 101 mmol/L (ref 98–111)
Creatinine, Ser: 0.67 mg/dL (ref 0.44–1.00)
GFR, Estimated: >60 mL/min (ref >60)
Glucose, Bld: 95 mg/dL (ref 70–99)
Potassium: 4.9 mmol/L (ref 3.5–5.1)
Sodium: 140 mmol/L (ref 135–145)
Total Bilirubin: 0.5 mg/dL (ref 0.0–1.2)
Total Protein: 7.3 g/dL (ref 6.5–8.1)

## 2024-01-13 LAB — APTT: aPTT: 29 s (ref 24–36)

## 2024-01-13 LAB — URINALYSIS, ROUTINE W REFLEX MICROSCOPIC
Bilirubin Urine: NEGATIVE
Glucose, UA: NEGATIVE mg/dL
Hgb urine dipstick: NEGATIVE
Ketones, ur: NEGATIVE mg/dL
Leukocytes,Ua: NEGATIVE
Nitrite: NEGATIVE
Protein, ur: NEGATIVE mg/dL
Specific Gravity, Urine: >1.046 — ABNORMAL HIGH (ref 1.005–1.030)
pH: 6 (ref 5.0–8.0)

## 2024-01-13 MED ORDER — BENZONATATE 100 MG PO CAPS
200.0000 mg | ORAL_CAPSULE | Freq: Once | ORAL | Status: AC
  Administered 2024-01-13: 200 mg via ORAL
  Filled 2024-01-13: qty 2

## 2024-01-13 MED ORDER — SODIUM CHLORIDE 0.9 % IV BOLUS
1000.0000 mL | Freq: Once | INTRAVENOUS | Status: AC
  Administered 2024-01-13: 1000 mL via INTRAVENOUS

## 2024-01-13 MED ORDER — BENZONATATE 100 MG PO CAPS: 100.0000 mg | ORAL_CAPSULE | Freq: Three times a day (TID) | ORAL | 0 refills | Status: DC

## 2024-01-13 MED ORDER — AMOXICILLIN-POT CLAVULANATE 875-125 MG PO TABS
1.0000 | ORAL_TABLET | Freq: Once | ORAL | Status: AC
  Administered 2024-01-13: 1 via ORAL
  Filled 2024-01-13: qty 1

## 2024-01-13 MED ORDER — IOHEXOL 350 MG/ML SOLN
75.0000 mL | Freq: Once | INTRAVENOUS | Status: AC | PRN
  Administered 2024-01-13: 75 mL via INTRAVENOUS

## 2024-01-13 MED ORDER — AZITHROMYCIN 250 MG PO TABS: 250.0000 mg | ORAL_TABLET | Freq: Every day | ORAL | 0 refills | Status: AC

## 2024-01-13 MED ORDER — ACETAMINOPHEN 500 MG PO TABS
1000.0000 mg | ORAL_TABLET | Freq: Once | ORAL | Status: AC
  Administered 2024-01-13: 1000 mg via ORAL
  Filled 2024-01-13: qty 2

## 2024-01-13 MED ORDER — AZITHROMYCIN 250 MG PO TABS
500.0000 mg | ORAL_TABLET | Freq: Once | ORAL | Status: AC
  Administered 2024-01-13: 500 mg via ORAL
  Filled 2024-01-13: qty 2

## 2024-01-13 MED ORDER — AMOXICILLIN-POT CLAVULANATE 875-125 MG PO TABS: 1.0000 | ORAL_TABLET | Freq: Two times a day (BID) | ORAL | 0 refills | Status: AC

## 2024-01-13 NOTE — ED Provider Notes (Signed)
[place] EMERGENCY DEPARTMENT AT [facility] Provider Note   CSN: [id number] Arrival date & time: 01/13/24  1236     History  Chief Complaint  Patient presents with   Hemoptysis    Tara Bray is a 53 y.o. female.  She has PMH of chronic venous insufficiency, anxiety and depression, renal abscess, remote history of traumatic intracranial hemorrhage in 2005 and history of easy bruising.  Presents ER today complaining of cough, sinus congestion and headache for past several days with an episode of nosebleed and another episode of hemoptysis.  She is initially she was having mucus with blowing her nose and with coughing that was green-tinged but then had an episode where her nose was bleeding after blowing it which resolved spontaneously, she had a separate episode where she coughed up frank blood.  This is also resolved.  She is not short of breath or having chest pain.  She does have a left frontal headache with worse congestion on that side that feels like a sinus headache she reports.  She talked to her PCP and they were worried about the headache stating that she hit her head bleed on that side and says she is feeling pressure wanted her to be further evaluated.  She denies any numbness tingling or weakness.  No blood thinner use, no aspirin use.  HPI     Home Medications Prior to Admission medications   Medication Sig Start Date End Date Taking? Authorizing Provider  acetaminophen (TYLENOL) 500 MG tablet Take 500 mg by mouth every 6 (six) hours as needed for moderate pain (pain score 4-6).   Yes [provider]  acetaminophen (TYLENOL) 650 MG CR tablet Take 650 mg by mouth every 8 (eight) hours as needed for pain.   Yes [provider]  amoxicillin-clavulanate (AUGMENTIN) 875-125 MG tablet Take 1 tablet by mouth every 12 (twelve) hours for 7 days. 01/13/24 01/20/24 Yes Hercules Hasler A, PA-C  azithromycin (ZITHROMAX) 250 MG tablet Take 1 tablet (250  mg total) by mouth daily for 4 days. Take one tablet by mouth daily 01/14/24 01/18/24 Yes Jolina Symonds A, PA-C  benzonatate (TESSALON) 100 MG capsule Take 1 capsule (100 mg total) by mouth every 8 (eight) hours. 01/13/24  Yes Constance Whittle A, PA-C  melatonin 3 MG TABS tablet Take 15 mg by mouth at bedtime as needed (sleep).   Yes [provider]  sertraline (ZOLOFT) 50 MG tablet Take 50 mg by mouth daily. 12/27/23  Yes [provider]      Allergies    Vicodin [hydrocodone-acetaminophen]    Review of Systems   Review of Systems  Physical Exam Updated Vital Signs BP 123/69   Pulse 61   Temp 98.4 F (36.9 C) (Oral)   Resp 17   Ht 5' (1.524 m)   Wt 74 kg   SpO2 98%   BMI 31.86 kg/m  Physical Exam Vitals and nursing note reviewed.  Constitutional:      General: She is not in acute distress.    Appearance: She is well-developed.  HENT:     Head: Normocephalic and atraumatic.     Right Ear: Tympanic membrane normal.     Left Ear: Tympanic membrane normal.     Nose:     Right Nostril: No epistaxis.     Left Nostril: No epistaxis.     Right Turbinates: Not enlarged or swollen.     Left Turbinates: Not enlarged or swollen.     Right  Sinus: No maxillary sinus tenderness or frontal sinus tenderness.     Left Sinus: Maxillary sinus tenderness and frontal sinus tenderness present.     Mouth/Throat:     Mouth: Mucous membranes are moist.     Pharynx: No oropharyngeal exudate or posterior oropharyngeal erythema.  Eyes:     Extraocular Movements: Extraocular movements intact.     Conjunctiva/sclera: Conjunctivae normal.     Pupils: Pupils are equal, round, and reactive to light.  Cardiovascular:     Rate and Rhythm: Normal rate and regular rhythm.     Heart sounds: No murmur heard. Pulmonary:     Effort: Pulmonary effort is normal. No respiratory distress.     Breath sounds: Normal breath sounds.  Abdominal:     Palpations: Abdomen is soft.     Tenderness:  There is no abdominal tenderness.  Musculoskeletal:        General: No swelling.     Cervical back: Neck supple.  Skin:    General: Skin is warm and dry.     Capillary Refill: Capillary refill takes less than 2 seconds.  Neurological:     General: No focal deficit present.     Mental Status: She is alert and oriented to person, place, and time.  Psychiatric:        Mood and Affect: Mood normal.     ED Results / Procedures / Treatments   Labs (all labs ordered are listed, but only abnormal results are displayed) Labs Reviewed  CBC WITH DIFFERENTIAL/PLATELET - Abnormal; Notable for the following components:      Result Value   WBC 12.9 (*)    Hemoglobin 15.3 (*)    HCT 46.2 (*)    Monocytes Absolute 1.2 (*)    All other components within normal limits  COMPREHENSIVE METABOLIC PANEL - Abnormal; Notable for the following components:   Calcium 11.9 (*)    All other components within normal limits  URINALYSIS, ROUTINE W REFLEX MICROSCOPIC - Abnormal; Notable for the following components:   Color, Urine STRAW (*)    Specific Gravity, Urine >1.046 (*)    All other components within normal limits  RESP PANEL BY RT-PCR (RSV, FLU A&B, COVID)  RVPGX2  PROTIME-INR  APTT    EKG None  Radiology CT Angio Chest PE W and/or Wo Contrast Result Date: 01/13/2024 CLINICAL DATA:  Hemoptysis EXAM: CT ANGIOGRAPHY CHEST WITH CONTRAST TECHNIQUE: Multidetector CT imaging of the chest was performed using the standard protocol during bolus administration of intravenous contrast. Multiplanar CT image reconstructions and MIPs were obtained to evaluate the vascular anatomy. RADIATION DOSE REDUCTION: This exam was performed according to the departmental dose-optimization program which includes automated exposure control, adjustment of the mA and/or kV according to patient size and/or use of iterative reconstruction technique. CONTRAST:  75mL OMNIPAQUE IOHEXOL 350 MG/ML SOLN COMPARISON:  CT 11/10/2018  FINDINGS: Cardiovascular: Satisfactory opacification of the pulmonary arteries to the segmental level. No evidence of pulmonary embolism. Normal heart size. No pericardial effusion. Nonaneurysmal aorta. No dissection is seen. Mediastinum/Nodes: Patent trachea. Enlarged heterogeneous left thyroid. No suspicious lymph nodes. Esophagus within normal limits Lungs/Pleura: Small pleural effusions. Nodular ground-glass densities and small foci of consolidation in the right upper lobe, superior right lower lobe and the left lower lobe. Upper Abdomen: Incompletely visualized area of hypoenhancement at the mid right kidney Musculoskeletal: No acute osseous abnormality Review of the MIP images confirms the above findings. IMPRESSION: 1. Negative for acute pulmonary embolus or aortic dissection. 2. Small pleural  effusions. Nodular ground-glass densities and small foci of consolidation in the right upper lobe, superior right lower lobe and left lower lobe, findings are suspicious for multifocal pneumonia. Imaging follow-up to resolution is recommended. 3. Incompletely visualized area of hypoenhancement at the mid right kidney, possible area of pyelonephritis. Correlate with urinalysis and consider dedicated abdominopelvic imaging. 4. Enlarged heterogeneous left thyroid. Recommend thyroid ultrasound (ref: J Am Coll Radiol. 2015 Feb;12(2): 143-50).This should be performed on a nonemergent basis Electronically Signed   By: Jasmine Pang M.D.   On: 01/13/2024 19:26   DG Chest 2 View Result Date: 01/13/2024 CLINICAL DATA:  Headache.  Nose bleed.  Hemoptysis. EXAM: CHEST - 2 VIEW COMPARISON:  05/23/2022 FINDINGS: Heart size is normal. Mediastinal shadows are normal. The lungs are clear. No bronchial thickening. No infiltrate, mass, effusion or collapse. Pulmonary vascularity is normal. No bony abnormality. IMPRESSION: Normal chest. Electronically Signed   By: Paulina Fusi M.D.   On: 01/13/2024 14:47   CT Head Wo Contrast Result  Date: 01/13/2024 CLINICAL DATA:  Headache.  Nose bleed. EXAM: CT HEAD WITHOUT CONTRAST TECHNIQUE: Contiguous axial images were obtained from the base of the skull through the vertex without intravenous contrast. RADIATION DOSE REDUCTION: This exam was performed according to the departmental dose-optimization program which includes automated exposure control, adjustment of the mA and/or kV according to patient size and/or use of iterative reconstruction technique. COMPARISON:  10/03/2021 FINDINGS: Brain: The brain shows a normal appearance without evidence of malformation, atrophy, old or acute small or large vessel infarction, mass lesion, hemorrhage, hydrocephalus or extra-axial collection. Vascular: No hyperdense vessel. No evidence of atherosclerotic calcification. Skull: Normal.  No traumatic finding.  No focal bone lesion. Sinuses/Orbits: Single opacified posterior ethmoid air cell on the right, not likely significant. No evidence of widespread sinus mucosal disease or opacification in the region studied. The lower portions of the maxillary sinuses are not included. Orbits negative. Other: None significant IMPRESSION: Normal head CT. Single opacified posterior ethmoid air cell on the right, not likely significant. No evidence of widespread sinus mucosal disease or opacification in the region studied. The lower portions of the maxillary sinuses are not included. Electronically Signed   By: Paulina Fusi M.D.   On: 01/13/2024 14:47    Procedures Procedures    Medications Ordered in ED Medications  acetaminophen (TYLENOL) tablet 1,000 mg (1,000 mg Oral Given 01/13/24 1516)  benzonatate (TESSALON) capsule 200 mg (200 mg Oral Given 01/13/24 1648)  sodium chloride 0.9 % bolus 1,000 mL (0 mLs Intravenous Stopped 01/13/24 2016)  iohexol (OMNIPAQUE) 350 MG/ML injection 75 mL (75 mLs Intravenous Contrast Given 01/13/24 1803)  amoxicillin-clavulanate (AUGMENTIN) 875-125 MG per tablet 1 tablet (1 tablet Oral Given  01/13/24 2033)  azithromycin (ZITHROMAX) tablet 500 mg (500 mg Oral Given 01/13/24 2033)    ED Course/ Medical Decision Making/ A&P Clinical Course as of 01/13/24 2123  Mon Jan 13, 2024  1430 3 to 4 daysof sinus congestion and pressure, also had an episode of nosebleed and separate episode of hemoptysis both of which have resolved.  She is not having any chest pain or shortness of breath or pleurisy.  Heart rate and oxygen are normal.  Discussed with patient suspect this may be sinusitis versus viral infection or bronchitis but specifically her PCP was worried about possible intracranial hemorrhage as she having left-sided headache, had prior traumatic ICH 2005.  She has left-sided headache, but has associated sinus tenderness and I think this is likely the culprit however will  obtain CT out of abundance of caution, she has no neurologic deficits on exam.  She is not on any anticoagulants, did not have any head trauma.  Labs and imaging are pending. [CB]    Clinical Course User Index [CB] Ma Rings, PA-C                                 Medical Decision Making This patient presents to the ED for concern of nosebleed and coughing up blood, this involves an extensive number of treatment options, and is a complaint that carries with it a high risk of complications and morbidity.  The differential diagnosis includes sinusitis, bronchitis, pneumonia, PE, coagulopathy, other   Co morbidities that complicate the patient evaluation :   Thrombocytopenia   Additional history obtained:  Additional history obtained from EMR External records from outside source obtained and reviewed including notes and labs   Lab Tests:  I Ordered, and personally interpreted labs.  The pertinent results include: Patient noted to have slightly increased hemoglobin 15.3, slightly increased white blood cell 12.9, CMP shows calcium elevated 11.9 no other electrolyte derangements, normal LFTs, negative COVID flu and  RSV, UA shows no UTI but increased specific gravity   Imaging Studies ordered:  I ordered imaging studies including chest x-ray which shows no pulmonary edema or infiltrate; CT head shows no intracranial hemorrhage or other acute findings I independently visualized and interpreted imaging within scope of identifying emergent findings  I agree with the radiologist interpretation  CT chest shows no PE but multifocal pneumonia and enlarged left thyroid, also incompletely visualized area of hypoenhancement in the mid right kidney-radiology recommended follow-up with UA and possible imaging if indicated     Problem List / ED Course / Critical interventions / Medication management  Hemoptysis-ultimately patient likely has multifocal pneumonia based on her CT and is being treated with Augmentin and azithromycin.  They want will also cover for sinusitis as patient has sinus tenderness and feels like she has sinus infection as well.  No other abnormal bleeding and no anemia.  Other lab work likely related to dehydration including her elevated hemoglobin and elevated calcium.  She was given IV fluids and encouraged to increase her p.o. intake.  CT of the chest was primarily done to rule out possible malignancy given her elevated calcium.  There is no sign of malignancy on the CT of her chest.  She was encouraged to follow-up with her PCP for thyroid ultrasound.  She is not taking any calcium or vitamin D supplements to suggest this is the cause of her elevated calcium, she is advised to avoid calcium and vitamin D supplements and antacids until follow-up with PCP, instructed to follow-up this week or early next week for repeat labs.  He had mentioned possible small area of pyelonephritis on CT the patient has no urinary symptoms and UA is negative.  Urine is concentrated consistent with other labs suggesting dehydration I ordered medication including Tylenol for headache Reevaluation of the patient  after these medicines showed that the patient improved I have reviewed the patients home medicines and have made adjustments as needed       Amount and/or Complexity of Data Reviewed Labs: ordered. Radiology: ordered.  Risk OTC drugs. Prescription drug management.            Final Clinical Impression(s) / ED Diagnoses Final diagnoses:  Pneumonia of both lungs due to infectious organism,  unspecified part of lung  Acute non-recurrent maxillary sinusitis    Rx / DC Orders ED Discharge Orders          Ordered    azithromycin (ZITHROMAX) 250 MG tablet  Daily        01/13/24 2121    amoxicillin-clavulanate (AUGMENTIN) 875-125 MG tablet  Every 12 hours        01/13/24 2121    benzonatate (TESSALON) 100 MG capsule  Every 8 hours        01/13/24 2123              Josem Kaufmann 01/13/24 2123    Gloris Manchester, MD 01/14/24 [phone]

## 2024-01-13 NOTE — ED Notes (Signed)
Pt taken to CT. nad

## 2024-01-13 NOTE — ED Notes (Signed)
 Lab called x2 to check on results.

## 2024-01-13 NOTE — ED Notes (Signed)
 Pt reports being tested by her PCP today for Flu and Covid, and test results returned Negative.

## 2024-01-13 NOTE — ED Notes (Signed)
 See triage notes. Brain bleed 2005. Headache x 3 days now. Has had headaches since but this one different as in pressure to left side of forehead and behind eyes only, no pain on right side of head. Denies vision changes. Denies weakness on one side more than the other. Pt moving all extremities. States has been having nose bleeds and not sure if it is draining and making her cough it up or not. BP wnl.

## 2024-01-13 NOTE — ED Notes (Signed)
 Patient attempting to provide urine sample at this time.

## 2024-01-13 NOTE — ED Notes (Signed)
 Pt did not want to be stuck anymore and agreed to oral hydration. PA aware. Pt has drank water and 240ml gatorade. Pa aware.

## 2024-01-13 NOTE — Discharge Instructions (Addendum)
 You are seen in the ER today for coughing up blood up and nosebleed. Your CT scan shows likely pneumonia, incidentally also found enlarged thyroid  on the left and you need an outpatient ultrasound.  Call your PCP to schedule this.  You will also need follow-up for pneumonia, the radiologist recommended follow-up imaging to ensure that it has resolved.  Your blood work also showed elevated calcium  level.  Do not take calcium  supplements, antacids containing calcium  such as Tums, vitamin D  supplements until you follow-up with your PCP, they need to recheck levels in a couple of days.  This could be due to dehydration so make sure you are drinking plenty of fluids.  Back to the ER if you have any new or worsening symptoms.

## 2024-01-13 NOTE — ED Triage Notes (Signed)
 Pt arrived via POV from home c/o hemoptysis for the past few days. Pt reports previous brian bleed. Pt reports coughing up blood, blood coming from her nose and cranial pressure.

## 2024-02-05 ENCOUNTER — Other Ambulatory Visit (HOSPITAL_COMMUNITY): Payer: Self-pay | Admitting: Nurse Practitioner

## 2024-02-05 DIAGNOSIS — Z9289 Personal history of other medical treatment: Secondary | ICD-10-CM

## 2024-02-12 ENCOUNTER — Ambulatory Visit (HOSPITAL_COMMUNITY)
Admission: RE | Admit: 2024-02-12 | Discharge: 2024-02-12 | Disposition: A | Discharge status: Home / Self Care | Source: Ambulatory Visit | Attending: Nurse Practitioner | Admitting: Nurse Practitioner

## 2024-02-12 DIAGNOSIS — Z9289 Personal history of other medical treatment: Secondary | ICD-10-CM | POA: Insufficient documentation

## 2024-04-13 ENCOUNTER — Ambulatory Visit (INDEPENDENT_AMBULATORY_CARE_PROVIDER_SITE_OTHER): Admitting: Internal Medicine

## 2024-04-13 VITALS — HR 112 | Resp 16 | Ht 61.0 in | Wt 175.0 lb

## 2024-04-13 DIAGNOSIS — R0689 Other abnormalities of breathing: Secondary | ICD-10-CM

## 2024-04-13 DIAGNOSIS — E611 Iron deficiency: Secondary | ICD-10-CM

## 2024-04-13 DIAGNOSIS — F419 Anxiety disorder, unspecified: Secondary | ICD-10-CM

## 2024-04-13 DIAGNOSIS — G2581 Restless legs syndrome: Secondary | ICD-10-CM | POA: Diagnosis not present

## 2024-04-13 DIAGNOSIS — F329 Major depressive disorder, single episode, unspecified: Secondary | ICD-10-CM | POA: Insufficient documentation

## 2024-04-13 DIAGNOSIS — G4700 Insomnia, unspecified: Secondary | ICD-10-CM | POA: Insufficient documentation

## 2024-04-13 DIAGNOSIS — F32A Depression, unspecified: Secondary | ICD-10-CM
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
## 2024-04-14 LAB — IRON,TIBC AND FERRITIN PANEL
Ferritin: 39 ng/mL (ref 15–150)
Iron Saturation: 30 % (ref 15–55)
Iron: 92 ug/dL (ref 27–159)
Total Iron Binding Capacity: 304 ug/dL (ref 250–450)
UIBC: 212 ug/dL (ref 131–425)

## 2024-07-07 ENCOUNTER — Encounter: Payer: Self-pay | Admitting: "Endocrinology

## 2024-07-07 ENCOUNTER — Ambulatory Visit (INDEPENDENT_AMBULATORY_CARE_PROVIDER_SITE_OTHER): Admitting: "Endocrinology

## 2024-07-07 VITALS — BP 122/80 | HR 61 | Ht 61.0 in | Wt 175.0 lb

## 2024-07-07 DIAGNOSIS — E559 Vitamin D deficiency, unspecified: Secondary | ICD-10-CM | POA: Diagnosis not present

## 2024-07-07 DIAGNOSIS — E042 Nontoxic multinodular goiter: Secondary | ICD-10-CM
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
## 2024-07-08 LAB — RENAL FUNCTION PANEL
Albumin: 4.3 g/dL (ref 3.6–5.1)
BUN: 9 mg/dL (ref 7–25)
CO2: 25 mmol/L (ref 20–32)
Calcium: 9.6 mg/dL (ref 8.6–10.4)
Chloride: 108 mmol/L (ref 98–110)
Creat: 0.61 mg/dL (ref 0.50–1.03)
Glucose, Bld: 90 mg/dL (ref 65–99)
Phosphorus: 3 mg/dL (ref 2.5–4.5)
Potassium: 3.5 mmol/L (ref 3.5–5.3)
Sodium: 141 mmol/L (ref 135–146)

## 2024-07-08 LAB — TSH: TSH: 0.52 m[IU]/L

## 2024-07-08 LAB — VITAMIN D 25 HYDROXY (VIT D DEFICIENCY, FRACTURES): Vit D, 25-Hydroxy: 24 ng/mL — ABNORMAL LOW (ref 30–100)

## 2024-07-08 LAB — T4, FREE: Free T4: 1.2 ng/dL (ref 0.8–1.8)

## 2024-07-14 ENCOUNTER — Ambulatory Visit
Admission: RE | Admit: 2024-07-14 | Discharge: 2024-07-14 | Disposition: A | Discharge status: Home / Self Care | Source: Ambulatory Visit | Attending: "Endocrinology | Admitting: "Endocrinology

## 2024-07-14 ENCOUNTER — Other Ambulatory Visit (HOSPITAL_COMMUNITY)
Admission: RE | Admit: 2024-07-14 | Discharge: 2024-07-14 | Disposition: A | Discharge status: Home / Self Care | Source: Ambulatory Visit | Attending: Physician Assistant | Admitting: Physician Assistant

## 2024-07-14 DIAGNOSIS — E042 Nontoxic multinodular goiter: Secondary | ICD-10-CM | POA: Insufficient documentation

## 2024-07-16 LAB — CYTOLOGY - NON PAP

## 2024-07-21 ENCOUNTER — Telehealth: Payer: Self-pay

## 2024-07-21 NOTE — Telephone Encounter (Signed)
 Pt asking about results from thyroid  biopsy.

## 2024-08-05 ENCOUNTER — Telehealth: Payer: Self-pay

## 2024-08-05 NOTE — Telephone Encounter (Signed)
 Pt called regarding results of US  thyroid  .

## 2024-08-05 NOTE — Telephone Encounter (Signed)
 Spoke to pt regarding results.

## 2024-08-24 ENCOUNTER — Encounter: Payer: Self-pay | Admitting: "Endocrinology

## 2024-08-24 ENCOUNTER — Ambulatory Visit (INDEPENDENT_AMBULATORY_CARE_PROVIDER_SITE_OTHER): Admitting: "Endocrinology

## 2024-08-24 VITALS — BP 110/80 | HR 84 | Ht 61.0 in | Wt 174.0 lb

## 2024-08-24 DIAGNOSIS — E559 Vitamin D deficiency, unspecified: Secondary | ICD-10-CM

## 2024-08-24 DIAGNOSIS — E042 Nontoxic multinodular goiter: Secondary | ICD-10-CM
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
## 2024-09-11 ENCOUNTER — Other Ambulatory Visit: Payer: Self-pay

## 2024-09-11 ENCOUNTER — Ambulatory Visit (HOSPITAL_COMMUNITY): Attending: Nurse Practitioner | Admitting: Physical Therapy

## 2024-09-11 DIAGNOSIS — I890 Lymphedema, not elsewhere classified: Secondary | ICD-10-CM | POA: Insufficient documentation
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
## 2024-09-16 ENCOUNTER — Encounter (HOSPITAL_COMMUNITY): Admitting: Physical Therapy

## 2024-09-18 ENCOUNTER — Encounter (HOSPITAL_COMMUNITY): Admitting: Physical Therapy

## 2024-09-21 ENCOUNTER — Encounter (HOSPITAL_COMMUNITY): Admitting: Physical Therapy

## 2024-09-23 ENCOUNTER — Encounter (HOSPITAL_COMMUNITY): Admitting: Physical Therapy

## 2024-09-25 ENCOUNTER — Encounter (HOSPITAL_COMMUNITY): Admitting: Physical Therapy

## 2024-09-28 ENCOUNTER — Encounter (HOSPITAL_COMMUNITY): Admitting: Physical Therapy

## 2024-09-30 ENCOUNTER — Encounter (HOSPITAL_COMMUNITY): Admitting: Physical Therapy

## 2024-10-02 ENCOUNTER — Encounter (HOSPITAL_COMMUNITY): Admitting: Physical Therapy

## 2024-10-05 ENCOUNTER — Encounter (HOSPITAL_COMMUNITY): Admitting: Physical Therapy

## 2024-10-07 ENCOUNTER — Encounter (HOSPITAL_COMMUNITY): Admitting: Physical Therapy

## 2024-10-09 ENCOUNTER — Encounter (HOSPITAL_COMMUNITY): Admitting: Physical Therapy

## 2024-10-12 ENCOUNTER — Encounter (HOSPITAL_COMMUNITY): Admitting: Physical Therapy

## 2024-10-14 ENCOUNTER — Encounter (HOSPITAL_COMMUNITY): Admitting: Physical Therapy

## 2024-10-16 ENCOUNTER — Encounter (HOSPITAL_COMMUNITY): Admitting: Physical Therapy

## 2024-10-19 ENCOUNTER — Encounter (HOSPITAL_COMMUNITY): Admitting: Physical Therapy

## 2024-10-21 ENCOUNTER — Encounter (HOSPITAL_COMMUNITY): Admitting: Physical Therapy

## 2024-10-23 ENCOUNTER — Encounter (HOSPITAL_COMMUNITY): Admitting: Physical Therapy

## 2024-10-26 ENCOUNTER — Encounter (HOSPITAL_COMMUNITY): Admitting: Physical Therapy

## 2024-10-28 ENCOUNTER — Encounter (HOSPITAL_COMMUNITY): Admitting: Physical Therapy

## 2025-02-22 ENCOUNTER — Ambulatory Visit: Admitting: "Endocrinology
